# Patient Record
Sex: Female | Born: 1990 | Race: White | Hispanic: No | Marital: Married | Smoking: Never smoker
Health system: Southern US, Community
[De-identification: ages and names within clinical notes are randomized; demographics above are authoritative.]

## PROBLEM LIST (undated history)

## (undated) ENCOUNTER — Inpatient Hospital Stay (HOSPITAL_COMMUNITY): Payer: Medicaid Other

## (undated) ENCOUNTER — Inpatient Hospital Stay (HOSPITAL_COMMUNITY): Payer: Self-pay

## (undated) DIAGNOSIS — Z789 Other specified health status: Secondary | ICD-10-CM

## (undated) HISTORY — PX: NO PAST SURGERIES: SHX2092

## (undated) HISTORY — PX: MYRINGOPLASTY: SUR873

---
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
## 2013-10-10 LAB — OB RESULTS CONSOLE GC/CHLAMYDIA
CHLAMYDIA, DNA PROBE: NEGATIVE
GC PROBE AMP, GENITAL: NEGATIVE

## 2013-11-06 NOTE — L&D Delivery Note (Signed)
Delivery Note  SVD viable female Apgars 8,9 over intact perineum.  Placenta delivered spontaneously intact with 3VC. good support and hemostasis noted.  PH art was sent.  [place] cord blood was not done.  Mother and baby were doing well.  EBL 400cc  Candice Campavid Lowe, MD

## 2013-11-10 LAB — OB RESULTS CONSOLE ANTIBODY SCREEN: ANTIBODY SCREEN: NEGATIVE

## 2013-11-10 LAB — OB RESULTS CONSOLE HIV ANTIBODY (ROUTINE TESTING): HIV: NONREACTIVE

## 2013-11-10 LAB — OB RESULTS CONSOLE RPR: RPR: NONREACTIVE

## 2013-11-10 LAB — OB RESULTS CONSOLE RUBELLA ANTIBODY, IGM: Rubella: IMMUNE

## 2013-11-10 LAB — OB RESULTS CONSOLE ABO/RH: RH TYPE: POSITIVE

## 2013-11-10 LAB — OB RESULTS CONSOLE HEPATITIS B SURFACE ANTIGEN: Hepatitis B Surface Ag: NEGATIVE

## 2013-11-11 ENCOUNTER — Inpatient Hospital Stay (HOSPITAL_COMMUNITY)
Admission: AD | Admit: 2013-11-11 | Discharge: 2013-11-11 | Disposition: A | Discharge status: Home / Self Care | Payer: Medicaid Other | Source: Ambulatory Visit | Attending: Obstetrics and Gynecology | Admitting: Obstetrics and Gynecology

## 2013-11-11 ENCOUNTER — Encounter (HOSPITAL_COMMUNITY): Payer: Self-pay

## 2013-11-11 DIAGNOSIS — N898 Other specified noninflammatory disorders of vagina: Secondary | ICD-10-CM | POA: Insufficient documentation

## 2013-11-11 DIAGNOSIS — O26899 Other specified pregnancy related conditions, unspecified trimester: Secondary | ICD-10-CM

## 2013-11-11 DIAGNOSIS — R109 Unspecified abdominal pain: Secondary | ICD-10-CM | POA: Insufficient documentation

## 2013-11-11 DIAGNOSIS — O99891 Other specified diseases and conditions complicating pregnancy: Secondary | ICD-10-CM | POA: Insufficient documentation

## 2013-11-11 DIAGNOSIS — O26859 Spotting complicating pregnancy, unspecified trimester: Secondary | ICD-10-CM | POA: Insufficient documentation

## 2013-11-11 DIAGNOSIS — O9989 Other specified diseases and conditions complicating pregnancy, childbirth and the puerperium: Principal | ICD-10-CM

## 2013-11-11 HISTORY — DX: Other specified health status: Z78.9

## 2013-11-11 LAB — URINALYSIS, ROUTINE W REFLEX MICROSCOPIC
BILIRUBIN URINE: NEGATIVE
Glucose, UA: NEGATIVE mg/dL
KETONES UR: NEGATIVE mg/dL
Leukocytes, UA: NEGATIVE
NITRITE: NEGATIVE
PH: 7 (ref 5.0–8.0)
Protein, ur: NEGATIVE mg/dL
Specific Gravity, Urine: 1.02 (ref 1.005–1.030)
Urobilinogen, UA: 0.2 mg/dL (ref 0.0–1.0)

## 2013-11-11 LAB — URINE MICROSCOPIC-ADD ON

## 2013-11-11 LAB — WET PREP, GENITAL
Clue Cells Wet Prep HPF POC: NONE SEEN
Trich, Wet Prep: NONE SEEN
Yeast Wet Prep HPF POC: NONE SEEN

## 2013-11-11 NOTE — MAU Note (Signed)
Patient states she had brown spotting x 2 this am, no active bleeding. States she is having a little cramping.

## 2013-11-11 NOTE — MAU Provider Note (Signed)
History     CSN: [id number]  Arrival date and time: 11/11/13 1011   First Provider Initiated Contact with Patient 11/11/13 1116      Chief Complaint  Patient presents with  . Vaginal Bleeding  . Abdominal Cramping   HPI  Pt is a 23 yo G1P0 at 5020w6d wks IUP here with brown spotting x 2 this morning, no active bleeding.  + intermittent cramping, none at this time.  No problems in pregnancy to date.     Past Medical History  Diagnosis Date  . Medical history non-contributory     Past Surgical History  Procedure Laterality Date  . Myringoplasty      History reviewed. No pertinent family history.  History  Substance Use Topics  . Smoking status: Never Smoker   . Smokeless tobacco: Not on file  . Alcohol Use: Yes    Allergies: No Known Allergies  Prescriptions prior to admission  Medication Sig Dispense Refill  . Prenatal Vit-Fe Fumarate-FA (PRENATAL MULTIVITAMIN) TABS tablet Take 1 tablet by mouth daily at 12 noon.        Review of Systems  Gastrointestinal: Positive for abdominal pain (cramping).  Genitourinary: Negative for dysuria, urgency and frequency.       Brown spotting  All other systems reviewed and are negative.   Physical Exam   Blood pressure 119/72, pulse 85, temperature 98 F (36.7 C), temperature source Oral, resp. rate 20, height 5\' 4"  (1.626 m), weight 60.782 kg (134 lb).  Physical Exam  Constitutional: She is oriented to person, place, and time. She appears well-developed and well-nourished.  HENT:  Head: Normocephalic.  Neck: Normal range of motion. Neck supple.  Cardiovascular: Normal rate, regular rhythm and normal heart sounds.   Respiratory: Effort normal and breath sounds normal.  GI: Soft. There is no tenderness.  Genitourinary: No bleeding around the vagina. Vaginal discharge (mucusy, dark brown discharge) found.  Cervix closed  Neurological: She is alert and oriented to person, place, and time.  Skin: Skin is warm and dry.    FHR 140  MAU Course  Procedures 1126 Dr. Marcelle OverlieHolland called goes directly to voicemail > left message 1200 Dr. Marcelle OverlieHolland called goes directly to voicemail > called office > reviewed HPI/exam/ob history > provide reassurance and have patient follow-up in office.    Results for orders placed during the hospital encounter of 11/11/13 (from the past 24 hour(s))  URINALYSIS, ROUTINE W REFLEX MICROSCOPIC     Status: Abnormal   Collection Time    11/11/13 10:15 AM      Result Value Range   Color, Urine YELLOW  YELLOW   APPearance CLEAR  CLEAR   Specific Gravity, Urine 1.020  1.005 - 1.030   pH 7.0  5.0 - 8.0   Glucose, UA NEGATIVE  NEGATIVE mg/dL   Hgb urine dipstick MODERATE (*) NEGATIVE   Bilirubin Urine NEGATIVE  NEGATIVE   Ketones, ur NEGATIVE  NEGATIVE mg/dL   Protein, ur NEGATIVE  NEGATIVE mg/dL   Urobilinogen, UA 0.2  0.0 - 1.0 mg/dL   Nitrite NEGATIVE  NEGATIVE   Leukocytes, UA NEGATIVE  NEGATIVE  URINE MICROSCOPIC-ADD ON     Status: Abnormal   Collection Time    11/11/13 10:15 AM      Result Value Range   Squamous Epithelial / LPF FEW (*) RARE   RBC / HPF 0-2  <3 RBC/hpf  WET PREP, GENITAL     Status: Abnormal   Collection Time    11/11/13 11:33  AM      Result Value Range   Yeast Wet Prep HPF POC NONE SEEN  NONE SEEN   Trich, Wet Prep NONE SEEN  NONE SEEN   Clue Cells Wet Prep HPF POC NONE SEEN  NONE SEEN   WBC, Wet Prep HPF POC FEW (*) NONE SEEN     Assessment and Plan  23 yo G1P0 at [redacted]w[redacted]d wks IUP Vaginal Discharge  Plan: Discharge to home Follow up in office Bleeding precautions given.    [facility] 11/11/2013, 11:17 AM

## 2013-11-11 NOTE — Progress Notes (Signed)
Written and verbal d/c instructions given and understanding voiced. 

## 2013-11-13 ENCOUNTER — Inpatient Hospital Stay (HOSPITAL_COMMUNITY): Payer: Medicaid Other

## 2013-11-13 ENCOUNTER — Inpatient Hospital Stay (HOSPITAL_COMMUNITY)
Admission: AD | Admit: 2013-11-13 | Discharge: 2013-11-13 | Disposition: A | Discharge status: Home / Self Care | Payer: Medicaid Other | Source: Ambulatory Visit | Attending: Obstetrics and Gynecology | Admitting: Obstetrics and Gynecology

## 2013-11-13 ENCOUNTER — Encounter (HOSPITAL_COMMUNITY): Payer: Self-pay | Admitting: *Deleted

## 2013-11-13 DIAGNOSIS — O4690 Antepartum hemorrhage, unspecified, unspecified trimester: Secondary | ICD-10-CM

## 2013-11-13 DIAGNOSIS — R109 Unspecified abdominal pain: Secondary | ICD-10-CM | POA: Insufficient documentation

## 2013-11-13 DIAGNOSIS — O209 Hemorrhage in early pregnancy, unspecified: Secondary | ICD-10-CM | POA: Insufficient documentation

## 2013-11-13 DIAGNOSIS — O4692 Antepartum hemorrhage, unspecified, second trimester: Secondary | ICD-10-CM

## 2013-11-13 NOTE — MAU Provider Note (Signed)
  History     CSN: [id number]  Arrival date and time: 11/13/13 [id number]   None     Chief Complaint  Patient presents with  . Vaginal Bleeding   HPI  Pt is G1P0 at [redacted] weeks pregnant who presents for evaluation of vaginal bleeding in pregnancy. Pt was seen 2 days ago with dark brown spotting, which has continued since then with a small clot This morning.  Pt denies UTI symptoms, constipation,diarrhea.  Pt denies nausea or vomiting. Pt was to f/u with scheduled appointment on 1/8 (tomorrow).  Pt called the office this morning and they told her  To come to MAU.  Past Medical History  Diagnosis Date  . Medical history non-contributory     Past Surgical History  Procedure Laterality Date  . Myringoplasty      Family History  Problem Relation Age of Onset  . Miscarriages / Stillbirths Maternal Grandmother   . Hypertension Maternal Grandmother     History  Substance Use Topics  . Smoking status: Never Smoker   . Smokeless tobacco: Not on file  . Alcohol Use: Yes    Allergies: No Known Allergies  Prescriptions prior to admission  Medication Sig Dispense Refill  . Prenatal Vit-Fe Fumarate-FA (PRENATAL MULTIVITAMIN) TABS tablet Take 1 tablet by mouth daily at 12 noon.        Review of Systems  Constitutional: Negative for fever and chills.  Gastrointestinal: Negative for nausea, abdominal pain and diarrhea.  Genitourinary: Negative for dysuria.   Physical Exam   Blood pressure 122/73, pulse 84, temperature 97.8 F (36.6 C), height 5\' 4"  (1.626 m), weight 63.05 kg (139 lb).  Physical Exam  Nursing note and vitals reviewed. Constitutional: She is oriented to person, place, and time. She appears well-developed and well-nourished. No distress.  HENT:  Head: Normocephalic.  Eyes: Pupils are equal, round, and reactive to light.  Neck: Normal range of motion.  Cardiovascular: Normal rate.   Respiratory: Effort normal.  GI: Soft. She exhibits no distension. There is no  tenderness. There is no rebound and no guarding.  Genitourinary:  Small amount of bright red blood in vault; cervix closed, NT  Musculoskeletal: Normal range of motion.  Neurological: She is alert and oriented to person, place, and time.  Skin: Skin is warm and dry.  Psychiatric: She has a normal mood and affect.    MAU Course  Procedures Discussed with Dr. Rana SnareLowe- will go ahead and do ultrasound today and have pt follow up tomorrow No results found. [facility]  11/13/2013   OBSTETRICAL ULTRASOUND: This exam was performed within a [place] [facility] Department. The OB US report was generated in the [facility] system, and faxed to the ordering physician.   This report is also available in [facility] [employer] and in the [employer] PACS. See [facility] Obstetric US report.  Assessment and Plan  Vaginal bleeding in 2nd trimester pregnancy- no known cause F/u at Dr. Vance GatherLowe's office tomorrow   Pamelia HoitLINEBERRY,SUSAN 11/13/2013, 10:47 AM

## 2013-11-13 NOTE — MAU Note (Signed)
Pt stated she went to the BR this morning a a big clot came out . C/O mild cramping.

## 2013-11-13 NOTE — Discharge Instructions (Signed)
Pelvic Rest Pelvic rest is sometimes recommended for women when:   The placenta is partially or completely covering the opening of the cervix (placenta previa).  There is bleeding between the uterine wall and the amniotic sac in the first trimester (subchorionic hemorrhage).  The cervix begins to open without labor starting (incompetent cervix, cervical insufficiency).  The labor is too early (preterm labor). HOME CARE INSTRUCTIONS  Do not have sexual intercourse, stimulation, or an orgasm.  Do not use tampons, douche, or put anything in the vagina.  Do not lift anything over 10 pounds (4.5 kg).  Avoid strenuous activity or straining your pelvic muscles. SEEK MEDICAL CARE IF:  You have any vaginal bleeding during pregnancy. Treat this as a potential emergency.  You have cramping pain felt low in the stomach (stronger than menstrual cramps).  You notice vaginal discharge (watery, mucus, or bloody).  You have a low, dull backache.  There are regular contractions or uterine tightening. SEEK IMMEDIATE MEDICAL CARE IF: You have vaginal bleeding and have placenta previa.  Document Released: 02/17/2011 Document Revised: 01/15/2012 Document Reviewed: 02/17/2011 [facility] Patient Information 2014 [place], [place].  Vaginal Bleeding During Pregnancy, First Trimester A small amount of bleeding (spotting) is relatively common in early pregnancy. It usually stops on its own. There are many causes for bleeding or spotting in early pregnancy. Some bleeding may be related to the pregnancy and some may not. Cramping with the bleeding is more serious and concerning. Tell your caregiver if you have any vaginal bleeding.  CAUSES   It is normal in most cases.  The pregnancy ends (miscarriage).  The pregnancy may end (threatened miscarriage).  Infection or inflammation of the cervix.  Growths (polyps) on the cervix.  Pregnancy happens outside of the uterus and in a fallopian tube (tubal  pregnancy).  Many tiny cysts in the uterus instead of pregnancy tissue (molar pregnancy). SYMPTOMS  Vaginal bleeding or spotting with or without cramps. DIAGNOSIS  To evaluate the pregnancy, your caregiver may:  Do a pelvic exam.  Take blood tests.  Do an ultrasound. It is very important to follow your caregiver's instructions.  TREATMENT   Evaluation of the pregnancy with blood tests and ultrasound.  Bed rest (getting up to use the bathroom only).  Rho-gam immunization if the mother is Rh negative and the father is Rh positive. HOME CARE INSTRUCTIONS   If your caregiver orders bed rest, you may need to make arrangements for the care of other children and for other responsibilities. However, your caregiver may allow you to continue light activity.  Keep track of the number of pads you use each day, how often you change pads and how soaked (saturated) they are. Write this down.  Do not use tampons. Do not douche.  Do not have sexual intercourse or orgasms until approved by your physician.  Save any tissue that you pass for your caregiver to see.  Take medicine for cramps only with your caregiver's permission.  Do not take aspirin because it can make you bleed. SEEK IMMEDIATE MEDICAL CARE IF:   You experience severe cramps in your stomach, back or belly (abdomen).  You have an oral temperature above 102 F (38.9 C), not controlled by medicine.  You pass large clots or tissue.  Your bleeding increases or you become light-headed, weak or have fainting episodes.  You develop chills.  You are leaking or have a gush of fluid from your vagina.  You pass out while having a bowel movement. That may  mean you have a ruptured tubal pregnancy. Document Released: 08/02/2005 Document Revised: 01/15/2012 Document Reviewed: 02/11/2009 [facility] Patient Information 2014 [place], [place].

## 2014-01-31 ENCOUNTER — Inpatient Hospital Stay (HOSPITAL_COMMUNITY)
Admission: AD | Admit: 2014-01-31 | Discharge: 2014-01-31 | Disposition: A | Discharge status: Home / Self Care | Payer: Medicaid Other | Source: Ambulatory Visit | Attending: Obstetrics and Gynecology | Admitting: Obstetrics and Gynecology

## 2014-01-31 ENCOUNTER — Encounter (HOSPITAL_COMMUNITY): Payer: Self-pay

## 2014-01-31 DIAGNOSIS — N858 Other specified noninflammatory disorders of uterus: Secondary | ICD-10-CM

## 2014-01-31 DIAGNOSIS — O99891 Other specified diseases and conditions complicating pregnancy: Secondary | ICD-10-CM | POA: Insufficient documentation

## 2014-01-31 DIAGNOSIS — M940 Chondrocostal junction syndrome [Tietze]: Secondary | ICD-10-CM | POA: Insufficient documentation

## 2014-01-31 DIAGNOSIS — O9989 Other specified diseases and conditions complicating pregnancy, childbirth and the puerperium: Principal | ICD-10-CM

## 2014-01-31 DIAGNOSIS — O4700 False labor before 37 completed weeks of gestation, unspecified trimester: Secondary | ICD-10-CM | POA: Insufficient documentation

## 2014-01-31 DIAGNOSIS — R079 Chest pain, unspecified: Secondary | ICD-10-CM | POA: Insufficient documentation

## 2014-01-31 DIAGNOSIS — N859 Noninflammatory disorder of uterus, unspecified: Secondary | ICD-10-CM

## 2014-01-31 LAB — FETAL FIBRONECTIN: Fetal Fibronectin: NEGATIVE

## 2014-01-31 LAB — URINALYSIS, ROUTINE W REFLEX MICROSCOPIC
Bilirubin Urine: NEGATIVE
Glucose, UA: NEGATIVE mg/dL
Hgb urine dipstick: NEGATIVE
Ketones, ur: NEGATIVE mg/dL
Nitrite: NEGATIVE
PH: 7 (ref 5.0–8.0)
PROTEIN: NEGATIVE mg/dL
Specific Gravity, Urine: 1.015 (ref 1.005–1.030)
UROBILINOGEN UA: 0.2 mg/dL (ref 0.0–1.0)

## 2014-01-31 LAB — URINE MICROSCOPIC-ADD ON

## 2014-01-31 MED ORDER — ACETAMINOPHEN 325 MG PO TABS
650.0000 mg | ORAL_TABLET | Freq: Once | ORAL | Status: AC
  Administered 2014-01-31: 650 mg via ORAL
  Filled 2014-01-31: qty 2

## 2014-01-31 NOTE — Discharge Instructions (Signed)

## 2014-01-31 NOTE — Progress Notes (Signed)
Rib pain changes with position

## 2014-01-31 NOTE — MAU Note (Signed)
Had pains in my lower ribs earlier this am and was able to go back to sleep. Awoke again about 0500 and had more rib pain. Hurts more when i take a deep breath or get in uncomfortable position.

## 2014-01-31 NOTE — MAU Provider Note (Signed)
History     CSN: [id number]  Arrival date and time: 01/31/14 0711   First Provider Initiated Contact with Patient 01/31/14 [phone]      Chief Complaint  Patient presents with  . Chest Pain   HPI This is a 23 y.o. female at [redacted]w[redacted]d who presents with c/o sharp pain in between ribs on both sides of anterior lower ribs.  Started this morning, and hurts worse with movement or deep breathing. Denies recent URI or fever. Denies palpitations. Denies OB complaints, does not feel any cramping.  RN Note: Had pains in my lower ribs earlier this am and was able to go back to sleep. Awoke again about 0500 and had more rib pain. Hurts more when i take a deep breath or get in uncomfortable position.       OB History   Grav Para Term Preterm Abortions TAB SAB Ect Mult Living   1               Past Medical History  Diagnosis Date  . Medical history non-contributory     Past Surgical History  Procedure Laterality Date  . Myringoplasty      Family History  Problem Relation Age of Onset  . Miscarriages / Stillbirths Maternal Grandmother   . Hypertension Maternal Grandmother     History  Substance Use Topics  . Smoking status: Never Smoker   . Smokeless tobacco: Not on file  . Alcohol Use: Yes    Allergies: No Known Allergies  Prescriptions prior to admission  Medication Sig Dispense Refill  . Prenatal Vit-Fe Fumarate-FA (PRENATAL MULTIVITAMIN) TABS tablet Take 1 tablet by mouth daily at 12 noon.        Review of Systems  Constitutional: Negative for fever, chills and malaise/fatigue.  Respiratory: Negative for cough, sputum production, shortness of breath and wheezing.   Cardiovascular: Positive for chest pain. Negative for palpitations, orthopnea and leg swelling.  Gastrointestinal: Negative for nausea, vomiting and abdominal pain.  Musculoskeletal: Negative for myalgias.  Neurological: Negative for headaches.   Physical Exam   Blood pressure 129/71, pulse 86, temperature  97.8 F (36.6 C), resp. rate 20, height 5\' 4"  (1.626 m), weight 71.305 kg (157 lb 3.2 oz), SpO2 100.00%.  Physical Exam  Constitutional: She is oriented to person, place, and time. She appears well-developed and well-nourished. No distress.  HENT:  Head: Normocephalic.  Cardiovascular: Normal rate, regular rhythm and normal heart sounds.  Exam reveals no gallop and no friction rub.   No murmur heard. Respiratory: Effort normal. No respiratory distress. She has no wheezes. She has no rales.  GI: Soft. There is no tenderness. There is no rebound and no guarding.  Fetal heart rate reactive Uterine irritability noted, patient does not feel them  Genitourinary: Vagina normal and uterus normal. No vaginal discharge found.  Cervix closed/long/high Fetal fibronectin collected  Musculoskeletal: Normal range of motion.  Neurological: She is alert and oriented to person, place, and time.  Skin: Skin is warm and dry.  Psychiatric: She has a normal mood and affect.    MAU Course  Procedures  MDM Discussed with Dr Marcelle Overlie.  Will send FFn and wait for result. Would not tocolyze if negative. PO hydration. Tylenol for rib pain Results for orders placed during the hospital encounter of 01/31/14 (from the past 24 hour(s))  URINALYSIS, ROUTINE W REFLEX MICROSCOPIC     Status: Abnormal   Collection Time    01/31/14  7:20 AM      Result  Value Ref Range   Color, Urine YELLOW  YELLOW   APPearance CLEAR  CLEAR   Specific Gravity, Urine 1.015  1.005 - 1.030   pH 7.0  5.0 - 8.0   Glucose, UA NEGATIVE  NEGATIVE mg/dL   Hgb urine dipstick NEGATIVE  NEGATIVE   Bilirubin Urine NEGATIVE  NEGATIVE   Ketones, ur NEGATIVE  NEGATIVE mg/dL   Protein, ur NEGATIVE  NEGATIVE mg/dL   Urobilinogen, UA 0.2  0.0 - 1.0 mg/dL   Nitrite NEGATIVE  NEGATIVE   Leukocytes, UA SMALL (*) NEGATIVE  URINE MICROSCOPIC-ADD ON     Status: Abnormal   Collection Time    01/31/14  7:20 AM      Result Value Ref Range   Squamous  Epithelial / LPF FEW (*) RARE   WBC, UA 3-6  <3 WBC/hpf   Bacteria, UA FEW (*) RARE   Urine-Other MUCOUS PRESENT    FETAL FIBRONECTIN     Status: None   Collection Time    01/31/14  8:42 AM      Result Value Ref Range   Fetal Fibronectin NEGATIVE  NEGATIVE    Assessment and Plan  A:  SIUP at 6641w3d        Costochondritis       Uterine irritability with negative fetal fibronectin  P:  Discharge home       Tylenol for pain, ice to area        PTL precautions        Followup in office          [facility],Lazaro Isenhower 01/31/2014, 8:43 AM

## 2014-03-27 LAB — OB RESULTS CONSOLE GBS: GBS: NEGATIVE

## 2014-04-19 ENCOUNTER — Inpatient Hospital Stay (HOSPITAL_COMMUNITY)
Admission: AD | Admit: 2014-04-19 | Discharge: 2014-04-19 | Disposition: A | Discharge status: Home / Self Care | Payer: 59 | Source: Ambulatory Visit | Attending: Obstetrics and Gynecology | Admitting: Obstetrics and Gynecology

## 2014-04-19 ENCOUNTER — Encounter (HOSPITAL_COMMUNITY): Payer: Self-pay | Admitting: *Deleted

## 2014-04-19 DIAGNOSIS — O479 False labor, unspecified: Secondary | ICD-10-CM | POA: Diagnosis not present

## 2014-04-19 NOTE — MAU Note (Signed)
Pt. Started to have contractions every 2 mins around 0450 this am. Feels that contractions are stronger when walking. Also, feeling pressure. Denies any leakage of fluid. Denies bleeding. Baby has been moving well.

## 2014-04-19 NOTE — Progress Notes (Signed)
Notified pt labor eval, cervix 1.5/50/-2, efm tracing reactive, will have pt walk x1 hr then recheck.

## 2014-04-19 NOTE — Discharge Instructions (Signed)
Third Trimester of Pregnancy  The third trimester is from week 29 through week 42, months 7 through 9. The third trimester is a time when the fetus is growing rapidly. At the end of the ninth month, the fetus is about 20 inches in length and weighs 6 10 pounds.   BODY CHANGES  Your body goes through many changes during pregnancy. The changes vary from woman to woman.    Your weight will continue to increase. You can expect to gain 25 35 pounds (11 16 kg) by the end of the pregnancy.   You may begin to get stretch marks on your hips, abdomen, and breasts.   You may urinate more often because the fetus is moving lower into your pelvis and pressing on your bladder.   You may develop or continue to have heartburn as a result of your pregnancy.   You may develop constipation because certain hormones are causing the muscles that push waste through your intestines to slow down.   You may develop hemorrhoids or swollen, bulging veins (varicose veins).   You may have pelvic pain because of the weight gain and pregnancy hormones relaxing your joints between the bones in your pelvis. Back aches may result from over exertion of the muscles supporting your posture.   Your breasts will continue to grow and be tender. A yellow discharge may leak from your breasts called colostrum.   Your belly button may stick out.   You may feel short of breath because of your expanding uterus.   You may notice the fetus "dropping," or moving lower in your abdomen.   You may have a bloody mucus discharge. This usually occurs a few days to a week before labor begins.   Your cervix becomes thin and soft (effaced) near your due date.  WHAT TO EXPECT AT YOUR PRENATAL EXAMS   You will have prenatal exams every 2 weeks until week 36. Then, you will have weekly prenatal exams. During a routine prenatal visit:   You will be weighed to make sure you and the fetus are growing normally.   Your blood pressure is taken.   Your abdomen will be  measured to track your baby's growth.   The fetal heartbeat will be listened to.   Any test results from the previous visit will be discussed.   You may have a cervical check near your due date to see if you have effaced.  At around 36 weeks, your caregiver will check your cervix. At the same time, your caregiver will also perform a test on the secretions of the vaginal tissue. This test is to determine if a type of bacteria, Group B streptococcus, is present. Your caregiver will explain this further.  Your caregiver may ask you:   What your birth plan is.   How you are feeling.   If you are feeling the baby move.   If you have had any abnormal symptoms, such as leaking fluid, bleeding, severe headaches, or abdominal cramping.   If you have any questions.  Other tests or screenings that may be performed during your third trimester include:   Blood tests that check for low iron levels (anemia).   Fetal testing to check the health, activity level, and growth of the fetus. Testing is done if you have certain medical conditions or if there are problems during the pregnancy.  FALSE LABOR  You may feel small, irregular contractions that eventually go away. These are called Braxton Hicks contractions, or   false labor. Contractions may last for hours, days, or even weeks before true labor sets in. If contractions come at regular intervals, intensify, or become painful, it is best to be seen by your caregiver.   SIGNS OF LABOR    Menstrual-like cramps.   Contractions that are 5 minutes apart or less.   Contractions that start on the top of the uterus and spread down to the lower abdomen and back.   A sense of increased pelvic pressure or back pain.   A watery or bloody mucus discharge that comes from the vagina.  If you have any of these signs before the 37th week of pregnancy, call your caregiver right away. You need to go to the hospital to get checked immediately.  HOME CARE INSTRUCTIONS    Avoid all  smoking, herbs, alcohol, and unprescribed drugs. These chemicals affect the formation and growth of the baby.   Follow your caregiver's instructions regarding medicine use. There are medicines that are either safe or unsafe to take during pregnancy.   Exercise only as directed by your caregiver. Experiencing uterine cramps is a good sign to stop exercising.   Continue to eat regular, healthy meals.   Wear a good support bra for breast tenderness.   Do not use hot tubs, steam rooms, or saunas.   Wear your seat belt at all times when driving.   Avoid raw meat, uncooked cheese, cat litter boxes, and soil used by cats. These carry germs that can cause birth defects in the baby.   Take your prenatal vitamins.   Try taking a stool softener (if your caregiver approves) if you develop constipation. Eat more high-fiber foods, such as fresh vegetables or fruit and whole grains. Drink plenty of fluids to keep your urine clear or pale yellow.   Take warm sitz baths to soothe any pain or discomfort caused by hemorrhoids. Use hemorrhoid cream if your caregiver approves.   If you develop varicose veins, wear support hose. Elevate your feet for 15 minutes, 3 4 times a day. Limit salt in your diet.   Avoid heavy lifting, wear low heal shoes, and practice good posture.   Rest a lot with your legs elevated if you have leg cramps or low back pain.   Visit your dentist if you have not gone during your pregnancy. Use a soft toothbrush to brush your teeth and be gentle when you floss.   A sexual relationship may be continued unless your caregiver directs you otherwise.   Do not travel far distances unless it is absolutely necessary and only with the approval of your caregiver.   Take prenatal classes to understand, practice, and ask questions about the labor and delivery.   Make a trial run to the hospital.   Pack your hospital bag.   Prepare the baby's nursery.   Continue to go to all your prenatal visits as directed  by your caregiver.  SEEK MEDICAL CARE IF:   You are unsure if you are in labor or if your water has broken.   You have dizziness.   You have mild pelvic cramps, pelvic pressure, or nagging pain in your abdominal area.   You have persistent nausea, vomiting, or diarrhea.   You have a bad smelling vaginal discharge.   You have pain with urination.  SEEK IMMEDIATE MEDICAL CARE IF:    You have a fever.   You are leaking fluid from your vagina.   You have spotting or bleeding from your vagina.     You have severe abdominal cramping or pain.   You have rapid weight loss or gain.   You have shortness of breath with chest pain.   You notice sudden or extreme swelling of your face, hands, ankles, feet, or legs.   You have not felt your baby move in over an hour.   You have severe headaches that do not go away with medicine.   You have vision changes.  Document Released: 10/17/2001 Document Revised: 06/25/2013 Document Reviewed: 12/24/2012  [employer] Patient Information 2014 [employer].

## 2014-04-28 ENCOUNTER — Encounter (HOSPITAL_COMMUNITY): Payer: Self-pay

## 2014-04-28 ENCOUNTER — Inpatient Hospital Stay (HOSPITAL_COMMUNITY)
Admission: AD | Admit: 2014-04-28 | Discharge: 2014-04-30 | DRG: 775 | Disposition: A | Discharge status: Home / Self Care | Payer: 59 | Source: Ambulatory Visit | Attending: Obstetrics and Gynecology | Admitting: Obstetrics and Gynecology

## 2014-04-28 DIAGNOSIS — K649 Unspecified hemorrhoids: Secondary | ICD-10-CM | POA: Diagnosis present

## 2014-04-28 DIAGNOSIS — O878 Other venous complications in the puerperium: Principal | ICD-10-CM | POA: Diagnosis present

## 2014-04-28 DIAGNOSIS — IMO0001 Reserved for inherently not codable concepts without codable children: Secondary | ICD-10-CM

## 2014-04-28 DIAGNOSIS — O479 False labor, unspecified: Secondary | ICD-10-CM | POA: Diagnosis present

## 2014-04-28 LAB — TYPE AND SCREEN
ABO/RH(D): AB POS
Antibody Screen: NEGATIVE

## 2014-04-28 LAB — CBC
HEMATOCRIT: 36.4 % (ref 36.0–46.0)
HEMOGLOBIN: 12.5 g/dL (ref 12.0–15.0)
MCH: 31.3 pg (ref 26.0–34.0)
MCHC: 34.3 g/dL (ref 30.0–36.0)
MCV: 91 fL (ref 78.0–100.0)
Platelets: 246 10*3/uL (ref 150–400)
RBC: 4 MIL/uL (ref 3.87–5.11)
RDW: 13.6 % (ref 11.5–15.5)
WBC: 16 10*3/uL — AB (ref 4.0–10.5)

## 2014-04-28 LAB — RPR

## 2014-04-28 LAB — ABO/RH: ABO/RH(D): AB POS

## 2014-04-28 MED ORDER — IBUPROFEN 600 MG PO TABS
600.0000 mg | ORAL_TABLET | Freq: Four times a day (QID) | ORAL | Status: DC
  Administered 2014-04-29 – 2014-04-30 (×7): 600 mg via ORAL
  Filled 2014-04-28 (×7): qty 1

## 2014-04-28 MED ORDER — SIMETHICONE 80 MG PO CHEW: 80.0000 mg | CHEWABLE_TABLET | ORAL | Status: DC | PRN

## 2014-04-28 MED ORDER — BUTORPHANOL TARTRATE 1 MG/ML IJ SOLN: 1.0000 mg | INTRAMUSCULAR | Status: DC | PRN

## 2014-04-28 MED ORDER — MEDROXYPROGESTERONE ACETATE 150 MG/ML IM SUSP: 150.0000 mg | INTRAMUSCULAR | Status: DC | PRN

## 2014-04-28 MED ORDER — ZOLPIDEM TARTRATE 5 MG PO TABS: 5.0000 mg | ORAL_TABLET | Freq: Every evening | ORAL | Status: DC | PRN

## 2014-04-28 MED ORDER — LACTATED RINGERS IV SOLN
INTRAVENOUS | Status: DC
Start: 2014-04-28 — End: 2014-04-28

## 2014-04-28 MED ORDER — BENZOCAINE-MENTHOL 20-0.5 % EX AERO
1.0000 "application " | INHALATION_SPRAY | CUTANEOUS | Status: DC | PRN
  Administered 2014-04-29: 1 via TOPICAL
  Filled 2014-04-28: qty 56

## 2014-04-28 MED ORDER — ONDANSETRON HCL 4 MG PO TABS: 4.0000 mg | ORAL_TABLET | ORAL | Status: DC | PRN

## 2014-04-28 MED ORDER — ERYTHROMYCIN 5 MG/GM OP OINT
TOPICAL_OINTMENT | OPHTHALMIC | Status: AC
  Filled 2014-04-28: qty 1

## 2014-04-28 MED ORDER — DIBUCAINE 1 % RE OINT
1.0000 "application " | TOPICAL_OINTMENT | RECTAL | Status: DC | PRN
  Administered 2014-04-30: 1 via RECTAL
  Filled 2014-04-28: qty 28

## 2014-04-28 MED ORDER — ONDANSETRON HCL 4 MG/2ML IJ SOLN: 4.0000 mg | Freq: Four times a day (QID) | INTRAMUSCULAR | Status: DC | PRN

## 2014-04-28 MED ORDER — FLEET ENEMA 7-19 GM/118ML RE ENEM: 1.0000 | ENEMA | RECTAL | Status: DC | PRN

## 2014-04-28 MED ORDER — OXYCODONE-ACETAMINOPHEN 5-325 MG PO TABS: 1.0000 | ORAL_TABLET | ORAL | Status: DC | PRN

## 2014-04-28 MED ORDER — WITCH HAZEL-GLYCERIN EX PADS
1.0000 "application " | MEDICATED_PAD | CUTANEOUS | Status: DC | PRN
  Administered 2014-04-30: 1 via TOPICAL

## 2014-04-28 MED ORDER — EPHEDRINE 5 MG/ML INJ
10.0000 mg | INTRAVENOUS | Status: DC | PRN
  Filled 2014-04-28: qty 2

## 2014-04-28 MED ORDER — PROMETHAZINE HCL 25 MG/ML IJ SOLN: 12.5000 mg | Freq: Four times a day (QID) | INTRAMUSCULAR | Status: DC | PRN

## 2014-04-28 MED ORDER — TETANUS-DIPHTH-ACELL PERTUSSIS 5-2.5-18.5 LF-MCG/0.5 IM SUSP: 0.5000 mL | Freq: Once | INTRAMUSCULAR | Status: DC

## 2014-04-28 MED ORDER — LACTATED RINGERS IV SOLN: 500.0000 mL | INTRAVENOUS | Status: DC | PRN

## 2014-04-28 MED ORDER — ONDANSETRON HCL 4 MG/2ML IJ SOLN: 4.0000 mg | INTRAMUSCULAR | Status: DC | PRN

## 2014-04-28 MED ORDER — PHENYLEPHRINE 40 MCG/ML (10ML) SYRINGE FOR IV PUSH (FOR BLOOD PRESSURE SUPPORT)
80.0000 ug | PREFILLED_SYRINGE | INTRAVENOUS | Status: DC | PRN
  Filled 2014-04-28: qty 2

## 2014-04-28 MED ORDER — OXYTOCIN BOLUS FROM INFUSION: 500.0000 mL | INTRAVENOUS | Status: DC

## 2014-04-28 MED ORDER — ACETAMINOPHEN 325 MG PO TABS: 650.0000 mg | ORAL_TABLET | ORAL | Status: DC | PRN

## 2014-04-28 MED ORDER — OXYTOCIN 40 UNITS IN LACTATED RINGERS INFUSION - SIMPLE MED: 62.5000 mL/h | INTRAVENOUS | Status: DC

## 2014-04-28 MED ORDER — TERBUTALINE SULFATE 1 MG/ML IJ SOLN: 0.2500 mg | Freq: Once | INTRAMUSCULAR | Status: DC | PRN

## 2014-04-28 MED ORDER — OXYTOCIN 40 UNITS IN LACTATED RINGERS INFUSION - SIMPLE MED
1.0000 m[IU]/min | INTRAVENOUS | Status: DC
  Administered 2014-04-28: 2 m[IU]/min via INTRAVENOUS
  Filled 2014-04-28: qty 1000

## 2014-04-28 MED ORDER — LACTATED RINGERS IV SOLN: 500.0000 mL | Freq: Once | INTRAVENOUS | Status: DC

## 2014-04-28 MED ORDER — SENNOSIDES-DOCUSATE SODIUM 8.6-50 MG PO TABS
2.0000 | ORAL_TABLET | ORAL | Status: DC
  Administered 2014-04-29 (×2): 2 via ORAL
  Filled 2014-04-28 (×2): qty 2

## 2014-04-28 MED ORDER — LIDOCAINE HCL (PF) 1 % IJ SOLN
30.0000 mL | INTRAMUSCULAR | Status: DC | PRN
  Filled 2014-04-28: qty 30

## 2014-04-28 MED ORDER — LANOLIN HYDROUS EX OINT: TOPICAL_OINTMENT | CUTANEOUS | Status: DC | PRN

## 2014-04-28 MED ORDER — MEASLES, MUMPS & RUBELLA VAC ~~LOC~~ INJ
0.5000 mL | INJECTION | Freq: Once | SUBCUTANEOUS | Status: DC
  Filled 2014-04-28: qty 0.5

## 2014-04-28 MED ORDER — DIPHENHYDRAMINE HCL 50 MG/ML IJ SOLN: 12.5000 mg | INTRAMUSCULAR | Status: DC | PRN

## 2014-04-28 MED ORDER — FENTANYL 2.5 MCG/ML BUPIVACAINE 1/10 % EPIDURAL INFUSION (WH - ANES): 14.0000 mL/h | INTRAMUSCULAR | Status: DC | PRN

## 2014-04-28 MED ORDER — CITRIC ACID-SODIUM CITRATE 334-500 MG/5ML PO SOLN: 30.0000 mL | ORAL | Status: DC | PRN

## 2014-04-28 MED ORDER — PRENATAL MULTIVITAMIN CH
1.0000 | ORAL_TABLET | Freq: Every day | ORAL | Status: DC
  Administered 2014-04-29 – 2014-04-30 (×2): 1 via ORAL
  Filled 2014-04-28 (×2): qty 1

## 2014-04-28 MED ORDER — IBUPROFEN 600 MG PO TABS: 600.0000 mg | ORAL_TABLET | Freq: Four times a day (QID) | ORAL | Status: DC | PRN

## 2014-04-28 MED ORDER — DIPHENHYDRAMINE HCL 25 MG PO CAPS: 25.0000 mg | ORAL_CAPSULE | Freq: Four times a day (QID) | ORAL | Status: DC | PRN

## 2014-04-28 NOTE — H&P (Signed)
[name] is a 23 y.o. female presenting for labor sxs.  Admiited with ctxs every 3 minutes and cervical change to 4 cm.  Pregnancy uncomplicated.  GBS-. History OB History   Grav Para Term Preterm Abortions TAB SAB Ect Mult Living   1              Past Medical History  Diagnosis Date  . Medical history non-contributory    Past Surgical History  Procedure Laterality Date  . Myringoplasty     Family History: family history includes Hypertension in her maternal grandmother; Miscarriages / [place] in her maternal grandmother. Social History:  reports that she has never smoked. She has never used smokeless tobacco. She reports that she does not drink alcohol or use illicit drugs.   Prenatal Transfer Tool  Maternal Diabetes: No Genetic Screening: Normal Maternal Ultrasounds/Referrals: Normal Fetal Ultrasounds or other Referrals:  None Maternal Substance Abuse:  No Significant Maternal Medications:  None Significant Maternal Lab Results:  None Other Comments:  None  ROS  Dilation: 5 Effacement (%): 100 Station: -1 Exam by:: lee Blood pressure 130/74, pulse 77, temperature 98.5 F (36.9 C), temperature source Oral, resp. rate 18, height 5\' 4"  (1.626 m), weight 78.472 kg (173 lb). Exam Physical Exam  Prenatal labs: ABO, Rh: --/--/AB POS (06/23 1111) Antibody: NEG (06/23 1111) Rubella: Immune (01/05 0000) RPR: Nonreactive (01/05 0000)  HBsAg: Negative (01/05 0000)  HIV: Non-reactive (01/05 0000)  GBS: Negative (05/22 0000)   Assessment/Plan: IUP at term in active labor Anticipate SVD   LOWE,DAVID C 04/28/2014, 1:45 PM

## 2014-04-28 NOTE — MAU Note (Signed)
2 cm in office yesterday.  Contractions stronger at 5:30 a.  No leaking or bleeding.  Baby moving well per pt.

## 2014-04-29 ENCOUNTER — Encounter (HOSPITAL_COMMUNITY): Payer: Self-pay | Admitting: Student

## 2014-04-29 LAB — CBC
HCT: 27 % — ABNORMAL LOW (ref 36.0–46.0)
Hemoglobin: 9.2 g/dL — ABNORMAL LOW (ref 12.0–15.0)
MCH: 31 pg (ref 26.0–34.0)
MCHC: 34.1 g/dL (ref 30.0–36.0)
MCV: 90.9 fL (ref 78.0–100.0)
PLATELETS: 221 10*3/uL (ref 150–400)
RBC: 2.97 MIL/uL — AB (ref 3.87–5.11)
RDW: 13.8 % (ref 11.5–15.5)
WBC: 20.8 10*3/uL — AB (ref 4.0–10.5)

## 2014-04-29 MED ORDER — HYDROCORTISONE ACE-PRAMOXINE 1-1 % RE FOAM
1.0000 | Freq: Two times a day (BID) | RECTAL | Status: DC
  Administered 2014-04-29 – 2014-04-30 (×3): 1 via RECTAL
  Filled 2014-04-29 (×2): qty 10

## 2014-04-29 NOTE — Progress Notes (Signed)
Post Partum Day 1 Subjective: no complaints, up ad lib, voiding and tolerating PO  Objective: Blood pressure 127/74, pulse 110, temperature 98.1 F (36.7 C), temperature source Oral, resp. rate 18, height 5\' 4"  (1.626 m), weight 173 lb (78.472 kg), unknown if currently breastfeeding.  Physical Exam:  General: alert and cooperative Lochia: appropriate Uterine Fundus: firm Incision: perineum intact, small hemorrhoids DVT Evaluation: No evidence of DVT seen on physical exam. Negative Homan's sign. No cords or calf tenderness. No significant calf/ankle edema.   Recent Labs  04/28/14 1111 04/29/14 0635  HGB 12.5 9.2*  HCT 36.4 27.0*    Assessment/Plan: Plan for discharge tomorrow proctofoam   LOS: 1 day   CURTIS,CAROL G 04/29/2014, 8:34 AM

## 2014-04-29 NOTE — Progress Notes (Signed)
Ur chart review completed.  

## 2014-04-29 NOTE — Lactation Note (Signed)
This note was copied from the chart of [name]. Lactation Consultation Note  Patient Name: [name] [id number]'s Date: 04/29/2014 Reason for consult: Initial assessment Per mom baby has been to the breast several times. @consult  baby hungry - LC assisted - and instructed mom prior to latch , breast massage , hand express,  (steady flow of colostrum noted ), latch with breast compressions until swallows. Mom used the cross cradle  Positioning and LC assisted with depth. Multiply swallows noted , increased with breast compressions. Per mom comfortable. Baby fed 12 mins while LC present and still feeding with swallows. Mother informed of post-discharge support and given phone number to the lactation department, including services for phone call assistance; out-patient appointments; and breastfeeding support group. List of other breastfeeding resources in the community given in the handout. Encouraged mother to call for problems or concerns related to breastfeeding.Mother informed of post-discharge support and given phone number to the lactation department, including services for phone call assistance; out-patient appointments; and breastfeeding support group. List of other breastfeeding resources in the community given in the handout. Encouraged mother to call for problems or concerns related to breastfeeding.   Maternal Data Formula Feeding for Exclusion: No Infant to breast within first hour of birth: Yes (30 mins ) Has patient been taught Hand Expression?: Yes Does the patient have breastfeeding experience prior to this delivery?: No  Feeding Feeding Type: Breast Fed Length of feed:  (multiply swallows )  LATCH Score/Interventions Latch: Grasps breast easily, tongue down, lips flanged, rhythmical sucking. Intervention(s): Adjust position;Assist with latch;Breast massage;Breast compression  Audible Swallowing: Spontaneous and intermittent Intervention(s): Skin to skin;Hand  expression  Type of Nipple: Everted at rest and after stimulation  Comfort (Breast/Nipple): Soft / non-tender     Hold (Positioning): Assistance needed to correctly position infant at breast and maintain latch. (worked depth ) Intervention(s): Breastfeeding basics reviewed;Support Pillows;Position options;Skin to skin  LATCH Score: 9  Lactation Tools Discussed/Used WIC Program: Yes [facility]([place] per grandma)   Consult Status Consult Status: Follow-up Date: 04/30/14 Follow-up type: In-patient    Kathrin Greathouseorio, Margaret Ann 04/29/2014, 11:51 AM

## 2014-04-30 MED ORDER — IBUPROFEN 600 MG PO TABS: 600.0000 mg | ORAL_TABLET | Freq: Four times a day (QID) | ORAL | Status: AC

## 2014-04-30 NOTE — Discharge Summary (Signed)
Obstetric Discharge Summary Reason for Admission: onset of labor Prenatal Procedures: ultrasound Intrapartum Procedures: spontaneous vaginal delivery Postpartum Procedures: none Complications-Operative and Postpartum: none Hemoglobin  Date Value Ref Range Status  04/29/2014 9.2* 12.0 - 15.0 g/dL Final     DELTA CHECK NOTED     REPEATED TO VERIFY     HCT  Date Value Ref Range Status  04/29/2014 27.0* 36.0 - 46.0 % Final    Physical Exam:  General: alert and cooperative Lochia: appropriate Uterine Fundus: firm Incision: perineum intact DVT Evaluation: No evidence of DVT seen on physical exam. Negative Homan's sign. No cords or calf tenderness. No significant calf/ankle edema.  Discharge Diagnoses: Term Pregnancy-delivered  Discharge Information: Date: 04/30/2014 Activity: pelvic rest Diet: routine Medications: PNV and Ibuprofen Condition: stable Instructions: refer to practice specific booklet Discharge to: home   Newborn Data: Live born female  Birth Weight: 6 lb 11.6 oz (3050 g) APGAR: 8, 9  Home with mother.  CURTIS,CAROL G 04/30/2014, 8:42 AM

## 2014-05-06 ENCOUNTER — Inpatient Hospital Stay (HOSPITAL_COMMUNITY): Admission: RE | Admit: 2014-05-06 | Payer: Medicaid Other | Source: Ambulatory Visit

## 2014-09-07 ENCOUNTER — Encounter (HOSPITAL_COMMUNITY): Payer: Self-pay | Admitting: Student

## 2017-08-10 DIAGNOSIS — R300 Dysuria: Secondary | ICD-10-CM | POA: Diagnosis not present

## 2017-08-16 DIAGNOSIS — H52223 Regular astigmatism, bilateral: Secondary | ICD-10-CM | POA: Diagnosis not present

## 2017-10-18 DIAGNOSIS — Z6821 Body mass index (BMI) 21.0-21.9, adult: Secondary | ICD-10-CM | POA: Diagnosis not present

## 2017-10-18 DIAGNOSIS — Z01419 Encounter for gynecological examination (general) (routine) without abnormal findings: Secondary | ICD-10-CM | POA: Diagnosis not present

## 2017-12-12 DIAGNOSIS — N870 Mild cervical dysplasia: Secondary | ICD-10-CM | POA: Diagnosis not present

## 2017-12-12 DIAGNOSIS — R87612 Low grade squamous intraepithelial lesion on cytologic smear of cervix (LGSIL): Secondary | ICD-10-CM | POA: Diagnosis not present

## 2018-02-11 DIAGNOSIS — R1084 Generalized abdominal pain: Secondary | ICD-10-CM | POA: Diagnosis not present

## 2018-02-11 DIAGNOSIS — R1031 Right lower quadrant pain: Secondary | ICD-10-CM | POA: Diagnosis not present

## 2018-02-11 DIAGNOSIS — R109 Unspecified abdominal pain: Secondary | ICD-10-CM | POA: Diagnosis not present

## 2018-02-11 DIAGNOSIS — B9689 Other specified bacterial agents as the cause of diseases classified elsewhere: Secondary | ICD-10-CM | POA: Diagnosis not present

## 2018-02-11 DIAGNOSIS — R1032 Left lower quadrant pain: Secondary | ICD-10-CM | POA: Diagnosis not present

## 2018-02-11 DIAGNOSIS — N760 Acute vaginitis: Secondary | ICD-10-CM | POA: Diagnosis not present

## 2018-02-12 DIAGNOSIS — N3000 Acute cystitis without hematuria: Secondary | ICD-10-CM | POA: Diagnosis not present

## 2018-02-12 DIAGNOSIS — N739 Female pelvic inflammatory disease, unspecified: Secondary | ICD-10-CM | POA: Diagnosis not present

## 2018-02-27 DIAGNOSIS — R87810 Cervical high risk human papillomavirus (HPV) DNA test positive: Secondary | ICD-10-CM | POA: Diagnosis not present

## 2018-03-22 DIAGNOSIS — R300 Dysuria: Secondary | ICD-10-CM | POA: Diagnosis not present

## 2018-04-02 DIAGNOSIS — Z1339 Encounter for screening examination for other mental health and behavioral disorders: Secondary | ICD-10-CM | POA: Diagnosis not present

## 2018-04-02 DIAGNOSIS — Z1331 Encounter for screening for depression: Secondary | ICD-10-CM | POA: Diagnosis not present

## 2018-04-02 DIAGNOSIS — R87612 Low grade squamous intraepithelial lesion on cytologic smear of cervix (LGSIL): Secondary | ICD-10-CM | POA: Diagnosis not present

## 2018-04-02 DIAGNOSIS — R87619 Unspecified abnormal cytological findings in specimens from cervix uteri: Secondary | ICD-10-CM | POA: Diagnosis not present

## 2018-07-22 DIAGNOSIS — Z23 Encounter for immunization: Secondary | ICD-10-CM | POA: Diagnosis not present

## 2018-08-08 DIAGNOSIS — Z0000 Encounter for general adult medical examination without abnormal findings: Secondary | ICD-10-CM | POA: Diagnosis not present

## 2018-08-08 DIAGNOSIS — R87610 Atypical squamous cells of undetermined significance on cytologic smear of cervix (ASC-US): Secondary | ICD-10-CM | POA: Diagnosis not present

## 2018-08-08 DIAGNOSIS — Z01419 Encounter for gynecological examination (general) (routine) without abnormal findings: Secondary | ICD-10-CM | POA: Diagnosis not present

## 2018-08-08 DIAGNOSIS — N3000 Acute cystitis without hematuria: Secondary | ICD-10-CM | POA: Diagnosis not present

## 2018-08-12 DIAGNOSIS — Z23 Encounter for immunization: Secondary | ICD-10-CM | POA: Diagnosis not present

## 2018-08-22 DIAGNOSIS — R87612 Low grade squamous intraepithelial lesion on cytologic smear of cervix (LGSIL): Secondary | ICD-10-CM | POA: Diagnosis not present

## 2018-09-20 DIAGNOSIS — Z23 Encounter for immunization: Secondary | ICD-10-CM | POA: Diagnosis not present

## 2018-12-03 DIAGNOSIS — N911 Secondary amenorrhea: Secondary | ICD-10-CM | POA: Diagnosis not present

## 2018-12-12 DIAGNOSIS — Z3480 Encounter for supervision of other normal pregnancy, unspecified trimester: Secondary | ICD-10-CM | POA: Diagnosis not present

## 2018-12-12 DIAGNOSIS — Z3685 Encounter for antenatal screening for Streptococcus B: Secondary | ICD-10-CM | POA: Diagnosis not present

## 2018-12-12 DIAGNOSIS — Z3481 Encounter for supervision of other normal pregnancy, first trimester: Secondary | ICD-10-CM | POA: Diagnosis not present

## 2018-12-12 DIAGNOSIS — Z124 Encounter for screening for malignant neoplasm of cervix: Secondary | ICD-10-CM | POA: Diagnosis not present

## 2018-12-12 DIAGNOSIS — Z331 Pregnant state, incidental: Secondary | ICD-10-CM | POA: Diagnosis not present

## 2018-12-12 DIAGNOSIS — Z3482 Encounter for supervision of other normal pregnancy, second trimester: Secondary | ICD-10-CM | POA: Diagnosis not present

## 2018-12-12 DIAGNOSIS — Z113 Encounter for screening for infections with a predominantly sexual mode of transmission: Secondary | ICD-10-CM | POA: Diagnosis not present

## 2018-12-12 DIAGNOSIS — Z3400 Encounter for supervision of normal first pregnancy, unspecified trimester: Secondary | ICD-10-CM | POA: Diagnosis not present

## 2018-12-12 LAB — OB RESULTS CONSOLE RPR: RPR: NONREACTIVE

## 2018-12-12 LAB — OB RESULTS CONSOLE GC/CHLAMYDIA
Chlamydia: NEGATIVE
Gonorrhea: NEGATIVE

## 2018-12-12 LAB — OB RESULTS CONSOLE ABO/RH: RH Type: POSITIVE

## 2018-12-12 LAB — OB RESULTS CONSOLE HEPATITIS B SURFACE ANTIGEN: Hepatitis B Surface Ag: NEGATIVE

## 2018-12-12 LAB — OB RESULTS CONSOLE RUBELLA ANTIBODY, IGM: Rubella: IMMUNE

## 2018-12-12 LAB — OB RESULTS CONSOLE ANTIBODY SCREEN: Antibody Screen: NEGATIVE

## 2018-12-12 LAB — OB RESULTS CONSOLE HIV ANTIBODY (ROUTINE TESTING): HIV: NONREACTIVE

## 2018-12-16 DIAGNOSIS — B373 Candidiasis of vulva and vagina: Secondary | ICD-10-CM | POA: Diagnosis not present

## 2018-12-26 DIAGNOSIS — Z113 Encounter for screening for infections with a predominantly sexual mode of transmission: Secondary | ICD-10-CM | POA: Diagnosis not present

## 2018-12-26 DIAGNOSIS — Z331 Pregnant state, incidental: Secondary | ICD-10-CM | POA: Diagnosis not present

## 2018-12-26 DIAGNOSIS — Z124 Encounter for screening for malignant neoplasm of cervix: Secondary | ICD-10-CM | POA: Diagnosis not present

## 2018-12-26 DIAGNOSIS — Z3400 Encounter for supervision of normal first pregnancy, unspecified trimester: Secondary | ICD-10-CM | POA: Diagnosis not present

## 2019-01-09 DIAGNOSIS — Z3A12 12 weeks gestation of pregnancy: Secondary | ICD-10-CM | POA: Diagnosis not present

## 2019-01-09 DIAGNOSIS — Z3401 Encounter for supervision of normal first pregnancy, first trimester: Secondary | ICD-10-CM | POA: Diagnosis not present

## 2019-01-09 DIAGNOSIS — Z3682 Encounter for antenatal screening for nuchal translucency: Secondary | ICD-10-CM | POA: Diagnosis not present

## 2019-02-28 DIAGNOSIS — Z3A19 19 weeks gestation of pregnancy: Secondary | ICD-10-CM | POA: Diagnosis not present

## 2019-02-28 DIAGNOSIS — Z363 Encounter for antenatal screening for malformations: Secondary | ICD-10-CM | POA: Diagnosis not present

## 2019-03-27 DIAGNOSIS — Z3A23 23 weeks gestation of pregnancy: Secondary | ICD-10-CM | POA: Diagnosis not present

## 2019-03-27 DIAGNOSIS — Z362 Encounter for other antenatal screening follow-up: Secondary | ICD-10-CM | POA: Diagnosis not present

## 2019-04-25 DIAGNOSIS — Z23 Encounter for immunization: Secondary | ICD-10-CM | POA: Diagnosis not present

## 2019-04-25 DIAGNOSIS — Z3480 Encounter for supervision of other normal pregnancy, unspecified trimester: Secondary | ICD-10-CM | POA: Diagnosis not present

## 2019-05-14 DIAGNOSIS — Z20828 Contact with and (suspected) exposure to other viral communicable diseases: Secondary | ICD-10-CM | POA: Diagnosis not present

## 2019-05-23 DIAGNOSIS — Z20828 Contact with and (suspected) exposure to other viral communicable diseases: Secondary | ICD-10-CM | POA: Diagnosis not present

## 2019-06-24 DIAGNOSIS — Z3480 Encounter for supervision of other normal pregnancy, unspecified trimester: Secondary | ICD-10-CM | POA: Diagnosis not present

## 2019-06-24 DIAGNOSIS — Z3685 Encounter for antenatal screening for Streptococcus B: Secondary | ICD-10-CM | POA: Diagnosis not present

## 2019-06-24 LAB — OB RESULTS CONSOLE GBS: GBS: POSITIVE

## 2019-07-10 ENCOUNTER — Telehealth (HOSPITAL_COMMUNITY): Payer: Self-pay | Admitting: *Deleted

## 2019-07-10 ENCOUNTER — Encounter (HOSPITAL_COMMUNITY): Payer: Self-pay | Admitting: *Deleted

## 2019-07-10 NOTE — Telephone Encounter (Signed)
Preadmission screen  

## 2019-07-11 ENCOUNTER — Telehealth (HOSPITAL_COMMUNITY): Payer: Self-pay | Admitting: *Deleted

## 2019-07-11 NOTE — Telephone Encounter (Signed)
Preadmission screen  

## 2019-07-15 ENCOUNTER — Telehealth (HOSPITAL_COMMUNITY): Payer: Self-pay | Admitting: *Deleted

## 2019-07-15 NOTE — Telephone Encounter (Signed)
Preadmission screen  

## 2019-07-16 ENCOUNTER — Telehealth (HOSPITAL_COMMUNITY): Payer: Self-pay | Admitting: *Deleted

## 2019-07-16 ENCOUNTER — Encounter (HOSPITAL_COMMUNITY): Payer: Self-pay | Admitting: *Deleted

## 2019-07-16 NOTE — Telephone Encounter (Signed)
Preadmission screen  

## 2019-07-21 ENCOUNTER — Other Ambulatory Visit (HOSPITAL_COMMUNITY)
Admission: RE | Admit: 2019-07-21 | Discharge: 2019-07-21 | Disposition: A | Discharge status: Home / Self Care | Payer: BC Managed Care – PPO | Source: Ambulatory Visit

## 2019-07-21 ENCOUNTER — Encounter (HOSPITAL_COMMUNITY)
Admission: AD | Disposition: A | Discharge status: Home / Self Care | Payer: Self-pay | Source: Home / Self Care | Attending: Obstetrics and Gynecology

## 2019-07-21 ENCOUNTER — Inpatient Hospital Stay (HOSPITAL_COMMUNITY)
Admission: AD | Admit: 2019-07-21 | Discharge: 2019-07-24 | DRG: 785 | Disposition: A | Discharge status: Home / Self Care | Payer: BC Managed Care – PPO | Attending: Obstetrics and Gynecology | Admitting: Obstetrics and Gynecology

## 2019-07-21 ENCOUNTER — Inpatient Hospital Stay (HOSPITAL_COMMUNITY): Payer: BC Managed Care – PPO | Admitting: Anesthesiology

## 2019-07-21 ENCOUNTER — Encounter (HOSPITAL_COMMUNITY): Payer: Self-pay | Admitting: *Deleted

## 2019-07-21 ENCOUNTER — Other Ambulatory Visit: Payer: Self-pay

## 2019-07-21 DIAGNOSIS — Z20828 Contact with and (suspected) exposure to other viral communicable diseases: Secondary | ICD-10-CM | POA: Diagnosis not present

## 2019-07-21 DIAGNOSIS — Z23 Encounter for immunization: Secondary | ICD-10-CM | POA: Diagnosis not present

## 2019-07-21 DIAGNOSIS — Z3A40 40 weeks gestation of pregnancy: Secondary | ICD-10-CM

## 2019-07-21 DIAGNOSIS — Z302 Encounter for sterilization: Secondary | ICD-10-CM | POA: Diagnosis not present

## 2019-07-21 DIAGNOSIS — O26893 Other specified pregnancy related conditions, third trimester: Secondary | ICD-10-CM | POA: Diagnosis not present

## 2019-07-21 DIAGNOSIS — O480 Post-term pregnancy: Secondary | ICD-10-CM | POA: Diagnosis not present

## 2019-07-21 DIAGNOSIS — Z98891 History of uterine scar from previous surgery: Secondary | ICD-10-CM

## 2019-07-21 DIAGNOSIS — O99824 Streptococcus B carrier state complicating childbirth: Principal | ICD-10-CM | POA: Diagnosis present

## 2019-07-21 DIAGNOSIS — Q825 Congenital non-neoplastic nevus: Secondary | ICD-10-CM | POA: Diagnosis not present

## 2019-07-21 LAB — TYPE AND SCREEN
ABO/RH(D): AB POS
Antibody Screen: NEGATIVE

## 2019-07-21 LAB — ABO/RH: ABO/RH(D): AB POS

## 2019-07-21 LAB — CBC
HCT: 37.8 % (ref 36.0–46.0)
Hemoglobin: 12.3 g/dL (ref 12.0–15.0)
MCH: 29.2 pg (ref 26.0–34.0)
MCHC: 32.5 g/dL (ref 30.0–36.0)
MCV: 89.8 fL (ref 80.0–100.0)
Platelets: 252 10*3/uL (ref 150–400)
RBC: 4.21 MIL/uL (ref 3.87–5.11)
RDW: 13.3 % (ref 11.5–15.5)
WBC: 11.6 10*3/uL — ABNORMAL HIGH (ref 4.0–10.5)
nRBC: 0 % (ref 0.0–0.2)

## 2019-07-21 LAB — POCT FERN TEST: POCT Fern Test: NEGATIVE

## 2019-07-21 LAB — SARS CORONAVIRUS 2 BY RT PCR (HOSPITAL ORDER, PERFORMED IN ~~LOC~~ HOSPITAL LAB): SARS Coronavirus 2: NEGATIVE

## 2019-07-21 LAB — RPR: RPR Ser Ql: NONREACTIVE

## 2019-07-21 SURGERY — Surgical Case
Anesthesia: Spinal | Laterality: Bilateral | Wound class: Clean Contaminated

## 2019-07-21 MED ORDER — PHENYLEPHRINE HCL-NACL 20-0.9 MG/250ML-% IV SOLN
INTRAVENOUS | Status: DC | PRN
  Administered 2019-07-21: 60 ug/min via INTRAVENOUS

## 2019-07-21 MED ORDER — OXYTOCIN 40 UNITS IN NORMAL SALINE INFUSION - SIMPLE MED
1.0000 m[IU]/min | INTRAVENOUS | Status: DC
  Administered 2019-07-21: 2 m[IU]/min via INTRAVENOUS

## 2019-07-21 MED ORDER — LACTATED RINGERS IV SOLN: 500.0000 mL | INTRAVENOUS | Status: DC | PRN

## 2019-07-21 MED ORDER — ONDANSETRON HCL 4 MG/2ML IJ SOLN
INTRAMUSCULAR | Status: DC | PRN
  Administered 2019-07-21: 4 mg via INTRAVENOUS

## 2019-07-21 MED ORDER — LACTATED RINGERS IV SOLN
INTRAVENOUS | Status: DC
  Administered 2019-07-21 – 2019-07-22 (×4): via INTRAVENOUS

## 2019-07-21 MED ORDER — OXYTOCIN BOLUS FROM INFUSION: 500.0000 mL | Freq: Once | INTRAVENOUS | Status: DC

## 2019-07-21 MED ORDER — LIDOCAINE HCL (PF) 1 % IJ SOLN: 30.0000 mL | INTRAMUSCULAR | Status: DC | PRN

## 2019-07-21 MED ORDER — ONDANSETRON HCL 4 MG/2ML IJ SOLN: 4.0000 mg | Freq: Four times a day (QID) | INTRAMUSCULAR | Status: DC | PRN

## 2019-07-21 MED ORDER — OXYTOCIN 40 UNITS IN NORMAL SALINE INFUSION - SIMPLE MED
INTRAVENOUS | Status: AC
  Filled 2019-07-21: qty 1000

## 2019-07-21 MED ORDER — TERBUTALINE SULFATE 1 MG/ML IJ SOLN: 0.2500 mg | Freq: Once | INTRAMUSCULAR | Status: DC | PRN

## 2019-07-21 MED ORDER — DEXAMETHASONE SODIUM PHOSPHATE 10 MG/ML IJ SOLN
INTRAMUSCULAR | Status: AC
  Filled 2019-07-21: qty 1

## 2019-07-21 MED ORDER — FENTANYL CITRATE (PF) 100 MCG/2ML IJ SOLN
INTRAMUSCULAR | Status: AC
  Filled 2019-07-21: qty 2

## 2019-07-21 MED ORDER — FENTANYL CITRATE (PF) 100 MCG/2ML IJ SOLN
INTRAMUSCULAR | Status: DC | PRN
  Administered 2019-07-21: 15 ug via INTRATHECAL

## 2019-07-21 MED ORDER — OXYCODONE-ACETAMINOPHEN 5-325 MG PO TABS: 1.0000 | ORAL_TABLET | ORAL | Status: DC | PRN

## 2019-07-21 MED ORDER — MORPHINE SULFATE (PF) 0.5 MG/ML IJ SOLN
INTRAMUSCULAR | Status: AC
  Filled 2019-07-21: qty 10

## 2019-07-21 MED ORDER — OXYTOCIN 40 UNITS IN NORMAL SALINE INFUSION - SIMPLE MED: 2.5000 [IU]/h | INTRAVENOUS | Status: DC

## 2019-07-21 MED ORDER — PHENYLEPHRINE HCL-NACL 20-0.9 MG/250ML-% IV SOLN
INTRAVENOUS | Status: AC
  Filled 2019-07-21: qty 250

## 2019-07-21 MED ORDER — ACETAMINOPHEN 325 MG PO TABS: 650.0000 mg | ORAL_TABLET | ORAL | Status: DC | PRN

## 2019-07-21 MED ORDER — OXYCODONE-ACETAMINOPHEN 5-325 MG PO TABS: 2.0000 | ORAL_TABLET | ORAL | Status: DC | PRN

## 2019-07-21 MED ORDER — PENICILLIN G 3 MILLION UNITS IVPB - SIMPLE MED
3.0000 10*6.[IU] | INTRAVENOUS | Status: DC
  Administered 2019-07-21 (×3): 3 10*6.[IU] via INTRAVENOUS
  Filled 2019-07-21 (×3): qty 100

## 2019-07-21 MED ORDER — FLEET ENEMA 7-19 GM/118ML RE ENEM: 1.0000 | ENEMA | RECTAL | Status: DC | PRN

## 2019-07-21 MED ORDER — FENTANYL CITRATE (PF) 100 MCG/2ML IJ SOLN
50.0000 ug | INTRAMUSCULAR | Status: DC | PRN
  Administered 2019-07-21: 100 ug via INTRAVENOUS
  Filled 2019-07-21: qty 2

## 2019-07-21 MED ORDER — ONDANSETRON HCL 4 MG/2ML IJ SOLN
INTRAMUSCULAR | Status: AC
  Filled 2019-07-21: qty 2

## 2019-07-21 MED ORDER — CEFAZOLIN SODIUM-DEXTROSE 2-3 GM-%(50ML) IV SOLR
INTRAVENOUS | Status: DC | PRN
  Administered 2019-07-21: 2 g via INTRAVENOUS

## 2019-07-21 MED ORDER — SODIUM CHLORIDE 0.9 % IV SOLN
5.0000 10*6.[IU] | Freq: Once | INTRAVENOUS | Status: AC
  Administered 2019-07-21: 5 10*6.[IU] via INTRAVENOUS
  Filled 2019-07-21: qty 5

## 2019-07-21 MED ORDER — SOD CITRATE-CITRIC ACID 500-334 MG/5ML PO SOLN
30.0000 mL | ORAL | Status: DC | PRN
  Administered 2019-07-21: 30 mL via ORAL
  Filled 2019-07-21: qty 30

## 2019-07-21 MED ORDER — BUPIVACAINE IN DEXTROSE 0.75-8.25 % IT SOLN
INTRATHECAL | Status: DC | PRN
  Administered 2019-07-21: 1.6 mL via INTRATHECAL

## 2019-07-21 MED ORDER — MORPHINE SULFATE (PF) 0.5 MG/ML IJ SOLN
INTRAMUSCULAR | Status: DC | PRN
  Administered 2019-07-21: .15 mg via INTRATHECAL

## 2019-07-21 SURGICAL SUPPLY — 37 items
ADH SKN CLS APL DERMABOND .7 (GAUZE/BANDAGES/DRESSINGS)
APL SKNCLS STERI-STRIP NONHPOA (GAUZE/BANDAGES/DRESSINGS) ×2
BENZOIN TINCTURE PRP APPL 2/3 (GAUZE/BANDAGES/DRESSINGS) ×2 IMPLANT
CHLORAPREP W/TINT 26ML (MISCELLANEOUS) ×3 IMPLANT
CLAMP CORD UMBIL (MISCELLANEOUS) IMPLANT
CLOTH BEACON ORANGE TIMEOUT ST (SAFETY) ×3 IMPLANT
DERMABOND ADVANCED (GAUZE/BANDAGES/DRESSINGS)
DERMABOND ADVANCED .7 DNX12 (GAUZE/BANDAGES/DRESSINGS) IMPLANT
DRSG OPSITE POSTOP 4X10 (GAUZE/BANDAGES/DRESSINGS) ×3 IMPLANT
ELECT REM PT RETURN 9FT ADLT (ELECTROSURGICAL) ×3
ELECTRODE REM PT RTRN 9FT ADLT (ELECTROSURGICAL) ×2 IMPLANT
EXTRACTOR VACUUM M CUP 4 TUBE (SUCTIONS) IMPLANT
GLOVE BIO SURGEON STRL SZ7.5 (GLOVE) ×3 IMPLANT
GLOVE BIOGEL PI IND STRL 7.0 (GLOVE) ×2 IMPLANT
GLOVE BIOGEL PI INDICATOR 7.0 (GLOVE) ×1
GOWN STRL REUS W/TWL LRG LVL3 (GOWN DISPOSABLE) ×6 IMPLANT
KIT ABG SYR 3ML LUER SLIP (SYRINGE) ×3 IMPLANT
NDL HYPO 25X5/8 SAFETYGLIDE (NEEDLE) ×1 IMPLANT
NEEDLE HYPO 25X5/8 SAFETYGLIDE (NEEDLE) ×3 IMPLANT
NS IRRIG 1000ML POUR BTL (IV SOLUTION) ×3 IMPLANT
PACK C SECTION WH (CUSTOM PROCEDURE TRAY) ×3 IMPLANT
PAD ABD 7.5X8 STRL (GAUZE/BANDAGES/DRESSINGS) ×2 IMPLANT
PAD OB MATERNITY 4.3X12.25 (PERSONAL CARE ITEMS) ×3 IMPLANT
PENCIL SMOKE EVAC W/HOLSTER (ELECTROSURGICAL) ×3 IMPLANT
SPONGE GAUZE 4X4 12PLY STER LF (GAUZE/BANDAGES/DRESSINGS) ×4 IMPLANT
STRIP CLOSURE SKIN 1/2X4 (GAUZE/BANDAGES/DRESSINGS) ×2 IMPLANT
SUT MNCRL 0 VIOLET CTX 36 (SUTURE) ×8 IMPLANT
SUT MONOCRYL 0 CTX 36 (SUTURE) ×4
SUT PDS AB 0 CTX 60 (SUTURE) ×3 IMPLANT
SUT PLAIN 0 NONE (SUTURE) ×2 IMPLANT
SUT PLAIN 2 0 (SUTURE) ×3
SUT PLAIN 2 0 XLH (SUTURE) IMPLANT
SUT PLAIN ABS 2-0 CT1 27XMFL (SUTURE) ×1 IMPLANT
SUT VIC AB 4-0 KS 27 (SUTURE) ×3 IMPLANT
TOWEL OR 17X24 6PK STRL BLUE (TOWEL DISPOSABLE) ×3 IMPLANT
TRAY FOLEY W/BAG SLVR 14FR LF (SET/KITS/TRAYS/PACK) ×3 IMPLANT
WATER STERILE IRR 1000ML POUR (IV SOLUTION) ×5 IMPLANT

## 2019-07-21 NOTE — Progress Notes (Signed)
Pt standing at [facility], c/o back pain, run guiding pt through hip compressions and lunges

## 2019-07-21 NOTE — Anesthesia Preprocedure Evaluation (Addendum)
Anesthesia Evaluation  Patient identified by MRN, date of birth, ID band Patient awake    Reviewed: Allergy & Precautions, NPO status , Patient's Chart, lab work & pertinent test results  Airway Mallampati: II       Dental no notable dental hx.    Pulmonary neg pulmonary ROS,    Pulmonary exam normal        Cardiovascular negative cardio ROS Normal cardiovascular exam     Neuro/Psych negative neurological ROS     GI/Hepatic Neg liver ROS,   Endo/Other  negative endocrine ROS  Renal/GU      Musculoskeletal   Abdominal   Peds  Hematology   Anesthesia Other Findings   Reproductive/Obstetrics (+) Pregnancy                             Anesthesia Physical Anesthesia Plan  ASA: II  Anesthesia Plan: Spinal   Post-op Pain Management:    Induction:   PONV Risk Score and Plan: 1 and Ondansetron  Airway Management Planned: Natural Airway  Additional Equipment: None  Intra-op Plan:   Post-operative Plan:   Informed Consent: I have reviewed the patients History and Physical, chart, labs and discussed the procedure including the risks, benefits and alternatives for the proposed anesthesia with the patient or authorized representative who has indicated his/her understanding and acceptance.       Plan Discussed with: CRNA  Anesthesia Plan Comments: (Lab Results      Component                Value               Date                      WBC                      11.6 (H)            07/21/2019                HGB                      12.3                07/21/2019                HCT                      37.8                07/21/2019                MCV                      89.8                07/21/2019                PLT                      252                 07/21/2019            COVID-19 Labs  No results for input(s): DDIMER, FERRITIN, LDH, CRP in the last 72 hours.  Lab  Results  Component                Value               Date                      [id number]              NEGATIVE            07/21/2019            )        Anesthesia Quick Evaluation

## 2019-07-21 NOTE — Progress Notes (Signed)
Pt in bed, hands and knees position, RN doing hip squeezes

## 2019-07-21 NOTE — Anesthesia Procedure Notes (Signed)
Spinal  Start time: 07/21/2019 11:41 PM End time: 07/21/2019 11:43 PM Staffing Anesthesiologist: Effie Berkshire, MD Performed: anesthesiologist  Preanesthetic Checklist Completed: patient identified, site marked, surgical consent, pre-op evaluation, timeout performed, IV checked, risks and benefits discussed and monitors and equipment checked Spinal Block Patient position: sitting Prep: site prepped and draped and DuraPrep Location: L3-4 Injection technique: single-shot Needle Needle type: Pencan  Needle gauge: 24 G Needle length: 10 cm Needle insertion depth: 10 cm Additional Notes Patient tolerated well. No immediate complications.

## 2019-07-21 NOTE — MAU Provider Note (Signed)
S: Ms. Breuna Loveall is a 28 y.o. G2P1001 at [redacted]w[redacted]d  who presents to MAU today complaining of leaking of fluid since she had a spot of discharge on her sheet this morning. No active LOF. She denies vaginal bleeding. She endorses contractions. She reports normal fetal movement.    O: BP 119/76   Pulse 75   Temp 97.8 F (36.6 C) (Oral)   Resp 18   Ht 5\' 4"  (1.626 m)   Wt 82.1 kg   LMP 10/12/2018   SpO2 100%   BMI 31.09 kg/m  GENERAL: Well-developed, well-nourished female in no acute distress.  HEAD: Normocephalic, atraumatic.  CHEST: Normal effort of breathing, regular heart rate ABDOMEN: Soft, nontender, gravid PELVIC: Normal external female genitalia. Vagina is pink and rugated. Cervix with normal contour, no lesions. Normal discharge.  No pooling.   Cervical exam:  Dilation: 1 Station: -2 Exam by:: Truitt Leep, RNC   Fetal Monitoring: Baseline: 135 Variability: mod Accelerations: present Decelerations: absent Contractions: q 3  Results for orders placed or performed during the hospital encounter of 07/21/19 (from the past 24 hour(s))  POCT fern test     Status: None   Collection Time: 07/21/19  8:51 AM  Result Value Ref Range   POCT Fern Test Negative = intact amniotic membranes   Type and screen [place]     Status: None (Preliminary result)   Collection Time: 07/21/19  9:14 AM  Result Value Ref Range   ABO/RH(D) PENDING    Antibody Screen PENDING    Sample Expiration      07/24/2019,2359 Performed at [facility], [place] [address], [place], [place] [address]      A: SIUP at [redacted]w[redacted]d  Membranes intact  P: Report given to RN to contact MD on call for further instructions  Starr Lake, CNM 07/21/2019 10:03 AM

## 2019-07-21 NOTE — Progress Notes (Signed)
FHT cat one Cs 3/90/-2/vtx AROM clear FHT cat one

## 2019-07-21 NOTE — Progress Notes (Signed)
Cx 5/puffy/-2 no change in about 5 hours Labor is adequate  Rec: C/S-D/W risks including infection, organ damage, bleeding/transfusion-HIV/Hep, DVT/PE, pneumonia. Patient wants BTL. D/W patient permanence, failure rate, increased ectopic risk. She is adamant she wants this and understands permanence.

## 2019-07-21 NOTE — MAU Note (Signed)
Presents with ctxs every 5 minutes apart, began @ 0515 this morning.  Unsure if LOF, denies VB.  Endorses +FM.

## 2019-07-21 NOTE — H&P (Signed)
[name] is a 28 y.o. female presenting for UCs. Pregnancy uncomplicated. OB History    Gravida  2   Para  1   Term  1   Preterm      AB      Living  1     SAB      TAB      Ectopic      Multiple      Live Births  1          Past Medical History:  Diagnosis Date  . Medical history non-contributory    Past Surgical History:  Procedure Laterality Date  . NO PAST SURGERIES     Family History: family history includes Diabetes in her maternal grandmother; Hypertension in her mother; Prostate cancer in her maternal grandfather. Social History:  reports that she has never smoked. She has never used smokeless tobacco. She reports previous alcohol use. She reports that she does not use drugs.     Maternal Diabetes: No Genetic Screening: Normal Maternal Ultrasounds/Referrals: Normal Fetal Ultrasounds or other Referrals:  None Maternal Substance Abuse:  No Significant Maternal Medications:  None Significant Maternal Lab Results: GBBS positive Other Comments:  None  Review of Systems  Eyes: Negative for blurred vision.  Gastrointestinal: Negative for abdominal pain.  Neurological: Negative for headaches.   Maternal Medical History:  Reason for admission: Contractions.   Contractions: Onset was 3-5 hours ago.    Fetal activity: Perceived fetal activity is normal.      Dilation: 1 Station: -2 Exam by:: F. Morris, RNC Blood pressure 118/76, pulse 85, temperature 97.7 F (36.5 C), temperature source Oral, resp. rate 18, height 5\' 4"  (1.626 m), weight 82.1 kg, last menstrual period 10/12/2018, SpO2 100 %. Maternal Exam:  Abdomen: Fetal presentation: vertex     Fetal Exam Fetal State Assessment: Category I - tracings are normal.     Physical Exam  Cardiovascular: Normal rate and regular rhythm.  Respiratory: Effort normal and breath sounds normal.  GI: Soft.  Neurological: She has normal reflexes.    Cs 2-3/90/-2/vtx  Prenatal labs: ABO, Rh:  AB/Positive/-- (02/06 0000) Antibody: Negative (02/06 0000) Rubella: Immune (02/06 0000) RPR: Nonreactive (02/06 0000)  HBsAg: Negative (02/06 0000)  HIV: Non-reactive (02/06 0000)  GBS: Positive/-- (08/18 0000)   Assessment/Plan: 28 yo G2P1 @ 40 2/7 Entering labor GBBS ATB ordered Pitocin prn   Shon Millet II 07/21/2019, 9:46 AM

## 2019-07-21 NOTE — Progress Notes (Signed)
Pt up to BR

## 2019-07-22 ENCOUNTER — Encounter (HOSPITAL_COMMUNITY): Payer: Self-pay

## 2019-07-22 LAB — CBC
HCT: 28.6 % — ABNORMAL LOW (ref 36.0–46.0)
Hemoglobin: 10 g/dL — ABNORMAL LOW (ref 12.0–15.0)
MCH: 30.5 pg (ref 26.0–34.0)
MCHC: 35 g/dL (ref 30.0–36.0)
MCV: 87.2 fL (ref 80.0–100.0)
Platelets: 242 10*3/uL (ref 150–400)
RBC: 3.28 MIL/uL — ABNORMAL LOW (ref 3.87–5.11)
RDW: 13.4 % (ref 11.5–15.5)
WBC: 31.3 10*3/uL — ABNORMAL HIGH (ref 4.0–10.5)
nRBC: 0 % (ref 0.0–0.2)

## 2019-07-22 MED ORDER — NALBUPHINE SYRINGE 5 MG/0.5 ML
5.0000 mg | INJECTION | INTRAMUSCULAR | Status: DC | PRN
  Filled 2019-07-22: qty 0.5

## 2019-07-22 MED ORDER — FENTANYL CITRATE (PF) 100 MCG/2ML IJ SOLN: 25.0000 ug | INTRAMUSCULAR | Status: DC | PRN

## 2019-07-22 MED ORDER — DEXAMETHASONE SODIUM PHOSPHATE 10 MG/ML IJ SOLN
INTRAMUSCULAR | Status: DC | PRN
  Administered 2019-07-22: 10 mg via INTRAVENOUS

## 2019-07-22 MED ORDER — SODIUM CHLORIDE 0.9 % IR SOLN
Status: DC | PRN
  Administered 2019-07-22 (×2): 1

## 2019-07-22 MED ORDER — ONDANSETRON HCL 4 MG/2ML IJ SOLN: 4.0000 mg | Freq: Three times a day (TID) | INTRAMUSCULAR | Status: DC | PRN

## 2019-07-22 MED ORDER — NALBUPHINE SYRINGE 5 MG/0.5 ML
5.0000 mg | INJECTION | Freq: Once | INTRAMUSCULAR | Status: DC | PRN
  Filled 2019-07-22: qty 0.5

## 2019-07-22 MED ORDER — COCONUT OIL OIL
1.0000 "application " | TOPICAL_OIL | Status: DC | PRN
  Administered 2019-07-23: 1 via TOPICAL

## 2019-07-22 MED ORDER — MEPERIDINE HCL 25 MG/ML IJ SOLN: 6.2500 mg | INTRAMUSCULAR | Status: DC | PRN

## 2019-07-22 MED ORDER — SCOPOLAMINE 1 MG/3DAYS TD PT72: 1.0000 | MEDICATED_PATCH | Freq: Once | TRANSDERMAL | Status: DC

## 2019-07-22 MED ORDER — ACETAMINOPHEN 160 MG/5ML PO SOLN: 325.0000 mg | Freq: Once | ORAL | Status: DC | PRN

## 2019-07-22 MED ORDER — IBUPROFEN 600 MG PO TABS
600.0000 mg | ORAL_TABLET | Freq: Four times a day (QID) | ORAL | Status: DC | PRN
  Administered 2019-07-22 – 2019-07-24 (×6): 600 mg via ORAL
  Filled 2019-07-22 (×6): qty 1

## 2019-07-22 MED ORDER — NALOXONE HCL 0.4 MG/ML IJ SOLN: 0.4000 mg | INTRAMUSCULAR | Status: DC | PRN

## 2019-07-22 MED ORDER — KETOROLAC TROMETHAMINE 30 MG/ML IJ SOLN
INTRAMUSCULAR | Status: AC
  Filled 2019-07-22: qty 1

## 2019-07-22 MED ORDER — DIPHENHYDRAMINE HCL 25 MG PO CAPS: 25.0000 mg | ORAL_CAPSULE | ORAL | Status: DC | PRN

## 2019-07-22 MED ORDER — SIMETHICONE 80 MG PO CHEW: 80.0000 mg | CHEWABLE_TABLET | ORAL | Status: DC | PRN

## 2019-07-22 MED ORDER — SIMETHICONE 80 MG PO CHEW
80.0000 mg | CHEWABLE_TABLET | ORAL | Status: DC
  Administered 2019-07-23 (×2): 80 mg via ORAL
  Filled 2019-07-22 (×2): qty 1

## 2019-07-22 MED ORDER — ACETAMINOPHEN 325 MG PO TABS: 325.0000 mg | ORAL_TABLET | Freq: Once | ORAL | Status: DC | PRN

## 2019-07-22 MED ORDER — KETOROLAC TROMETHAMINE 30 MG/ML IJ SOLN
30.0000 mg | Freq: Four times a day (QID) | INTRAMUSCULAR | Status: DC | PRN
  Administered 2019-07-22: 30 mg via INTRAVENOUS

## 2019-07-22 MED ORDER — SIMETHICONE 80 MG PO CHEW
80.0000 mg | CHEWABLE_TABLET | Freq: Three times a day (TID) | ORAL | Status: DC
  Administered 2019-07-22 – 2019-07-24 (×5): 80 mg via ORAL
  Filled 2019-07-22 (×5): qty 1

## 2019-07-22 MED ORDER — SODIUM CHLORIDE 0.9% FLUSH: 3.0000 mL | INTRAVENOUS | Status: DC | PRN

## 2019-07-22 MED ORDER — NALOXONE HCL 4 MG/10ML IJ SOLN
1.0000 ug/kg/h | INTRAVENOUS | Status: DC | PRN
  Filled 2019-07-22: qty 5

## 2019-07-22 MED ORDER — DIBUCAINE (PERIANAL) 1 % EX OINT: 1.0000 "application " | TOPICAL_OINTMENT | CUTANEOUS | Status: DC | PRN

## 2019-07-22 MED ORDER — KETOROLAC TROMETHAMINE 30 MG/ML IJ SOLN: 30.0000 mg | Freq: Four times a day (QID) | INTRAMUSCULAR | Status: DC | PRN

## 2019-07-22 MED ORDER — WITCH HAZEL-GLYCERIN EX PADS: 1.0000 "application " | MEDICATED_PAD | CUTANEOUS | Status: DC | PRN

## 2019-07-22 MED ORDER — LACTATED RINGERS IV SOLN
INTRAVENOUS | Status: DC
  Administered 2019-07-22: 11:00:00 via INTRAVENOUS

## 2019-07-22 MED ORDER — DIPHENHYDRAMINE HCL 25 MG PO CAPS: 25.0000 mg | ORAL_CAPSULE | Freq: Four times a day (QID) | ORAL | Status: DC | PRN

## 2019-07-22 MED ORDER — ZOLPIDEM TARTRATE 5 MG PO TABS: 5.0000 mg | ORAL_TABLET | Freq: Every evening | ORAL | Status: DC | PRN

## 2019-07-22 MED ORDER — PRENATAL MULTIVITAMIN CH
1.0000 | ORAL_TABLET | Freq: Every day | ORAL | Status: DC
  Administered 2019-07-22 – 2019-07-23 (×2): 1 via ORAL
  Filled 2019-07-22: qty 1

## 2019-07-22 MED ORDER — SODIUM CHLORIDE 0.9 % IV SOLN
INTRAVENOUS | Status: DC | PRN
  Administered 2019-07-22: via INTRAVENOUS

## 2019-07-22 MED ORDER — OXYTOCIN 40 UNITS IN NORMAL SALINE INFUSION - SIMPLE MED: 2.5000 [IU]/h | INTRAVENOUS | Status: AC

## 2019-07-22 MED ORDER — DIPHENHYDRAMINE HCL 50 MG/ML IJ SOLN: 12.5000 mg | INTRAMUSCULAR | Status: DC | PRN

## 2019-07-22 MED ORDER — SENNOSIDES-DOCUSATE SODIUM 8.6-50 MG PO TABS
2.0000 | ORAL_TABLET | ORAL | Status: DC
  Administered 2019-07-23 (×2): 2 via ORAL
  Filled 2019-07-22 (×2): qty 2

## 2019-07-22 MED ORDER — HYDROMORPHONE HCL 1 MG/ML IJ SOLN: 0.2000 mg | INTRAMUSCULAR | Status: DC | PRN

## 2019-07-22 MED ORDER — ACETAMINOPHEN 325 MG PO TABS
650.0000 mg | ORAL_TABLET | ORAL | Status: DC | PRN
  Administered 2019-07-22 – 2019-07-23 (×3): 650 mg via ORAL
  Filled 2019-07-22 (×3): qty 2

## 2019-07-22 MED ORDER — SODIUM CHLORIDE 0.9 % IV SOLN
INTRAVENOUS | Status: DC | PRN
  Administered 2019-07-22: 40 [IU] via INTRAVENOUS

## 2019-07-22 MED ORDER — OXYCODONE HCL 5 MG PO TABS: 5.0000 mg | ORAL_TABLET | ORAL | Status: DC | PRN

## 2019-07-22 MED ORDER — TETANUS-DIPHTH-ACELL PERTUSSIS 5-2.5-18.5 LF-MCG/0.5 IM SUSP: 0.5000 mL | Freq: Once | INTRAMUSCULAR | Status: DC

## 2019-07-22 MED ORDER — ACETAMINOPHEN 10 MG/ML IV SOLN: 1000.0000 mg | Freq: Once | INTRAVENOUS | Status: DC | PRN

## 2019-07-22 MED ORDER — MENTHOL 3 MG MT LOZG: 1.0000 | LOZENGE | OROMUCOSAL | Status: DC | PRN

## 2019-07-22 MED ORDER — PROMETHAZINE HCL 25 MG/ML IJ SOLN: 6.2500 mg | INTRAMUSCULAR | Status: DC | PRN

## 2019-07-22 MED ORDER — LACTATED RINGERS IV SOLN: INTRAVENOUS | Status: DC

## 2019-07-22 NOTE — Lactation Note (Signed)
This note was copied from a baby's chart. Lactation Consultation Note  Patient Name: [name] [id number] Date: 07/22/2019 Reason for consult: Initial assessment;Term P2, 6 hour female infant. Infant has one void and one stool since birth. Per mom, she feels  breastfeeding is going well and she has breastfed infant three times since birth.  Mom is experienced at breastfeedingm she breastfed her 28 year old for one year. Per mom, she last breastfed infant at 6:15 am for 5 minutes prior to [facility] entering the room.  Infant in alert state in basinet but not cuing to breastfed at this time. Mom knows how to hand express.  Mom has DEBP at home. Mom knows to breastfed according hunger cues, 8 to 12 times within 24 hours and on demand. Mom knows to ask Nurse or [place] if she has any breastfeeding questions, concerns or need assistance with latching infant to breast.  Reviewed Baby & Me book's Breastfeeding Basics.  Mom made aware of O/P services, breastfeeding support groups, community resources, and our phone # for post-discharge questions.   Maternal Data Formula Feeding for Exclusion: No Has patient been taught Hand Expression?: Yes(Per mom, she knows how to do hand expression.) Does the patient have breastfeeding experience prior to this delivery?: Yes  Feeding Feeding Type: Breast Fed  LATCH Score Latch: Repeated attempts needed to sustain latch, nipple held in mouth throughout feeding, stimulation needed to elicit sucking reflex.  Audible Swallowing: A few with stimulation  Type of Nipple: Everted at rest and after stimulation  Comfort (Breast/Nipple): Soft / non-tender  Hold (Positioning): Assistance needed to correctly position infant at breast and maintain latch.  LATCH Score: 7  Interventions Interventions: Breast feeding basics reviewed;Skin to skin;DEBP  Lactation Tools Discussed/Used WIC Program: No   Consult Status Consult Status: Follow-up Date: 07/22/19 Follow-up  type: In-patient    Vicente Serene 07/22/2019, 6:31 AM

## 2019-07-22 NOTE — Brief Op Note (Signed)
07/21/2019  12:47 AM  PATIENT:  Harmon Pier  28 y.o. female  PRE-OPERATIVE DIAGNOSIS:  Primary unscheduled cesarean section with bilateral tubal ligation; arrest of dilation; desires sterilization  POST-OPERATIVE DIAGNOSIS:  Primary unscheduled cesarean section with bilateral tubal ligation; arrest of dilation; desires sterilization  PROCEDURE:  Procedure(s): CESAREAN SECTION WITH BILATERAL TUBAL LIGATION (Bilateral)  SURGEON:  Surgeon(s) and Role:    * Everlene Farrier, MD - Primary  PHYSICIAN ASSISTANT:   ASSISTANTS: none   ANESTHESIA:   spinal  EBL:  Per anesthesia record   BLOOD ADMINISTERED:none  DRAINS: Urinary Catheter (Foley)   LOCAL MEDICATIONS USED:  NONE  SPECIMEN:  Source of Specimen:  bilateral fallopian tube segments  DISPOSITION OF SPECIMEN:  PATHOLOGY  COUNTS:  YES  TOURNIQUET:  * No tourniquets in log *  DICTATION: .Other Dictation: Dictation Number [id number]  PLAN OF CARE: Admit to inpatient   PATIENT DISPOSITION:  PACU - hemodynamically stable.   Delay start of Pharmacological VTE agent (>24hrs) due to surgical blood loss or risk of bleeding: not applicable

## 2019-07-22 NOTE — Op Note (Signed)
[name], [name] MEDICAL RECORD [id number] ACCOUNT [id number] DATE OF BIRTH:[birth year] FACILITY: [facility] LOCATION: [place], [place]  OPERATIVE REPORT  DATE OF PROCEDURE:  07/22/2019  PREOPERATIVE DIAGNOSES: 1.  Arrest of cervical dilation. 2.  Desires permanent sterilization.  POSTOPERATIVE DIAGNOSES: 1.  Arrest of dilation. 2.  Desires permanent sterilization.  PROCEDURE:  Low transverse cesarean section and bilateral tubal ligation.  SURGEON:  Everlene Farrier, MD  ANESTHESIA:  Spinal, Dr. Greggory Keen BLOOD LOSS:  Per anesthesiology.  FINDINGS:  Viable infant, Apgars and weight pending.  INDICATIONS AND CONSENT:  This patient is a 28 year old G2 P1 at 40-3/7 weeks who presents in spontaneous labor.  Artificial rupture of membranes for clear fluid was carried out.  She was subsequently started on Pitocin.  She advanced to 5 cm.  IUPC was  placed.  Despite good labor, she remained at 5 cm for nearly 5 hours.  Cervix was puffy and the station was -2.  Recommendation for cesarean section was made.  Potential risks and complications were discussed including but not limited to infection, organ  damage, bleeding requiring transfusion of blood products with HIV and hepatitis acquisition, DVT, PE, pneumonia.  The patient also desires permanent sterilization.  The permanence of the procedure was stressed, and the patient states that she adamantly  wants tubal ligation.  Failure rate and ectopic risk were also reviewed.  The patient states she understands and agrees, and consent was signed on the chart.  DESCRIPTION OF PROCEDURE:  The patient was taken to the operating room where she was identified.  Spinal anesthetic was placed per Anesthesiology, and she was placed in the dorsal supine position with a 15-degree left lateral wedge.  She was prepped  vaginally with Betadine.  Foley catheter was placed and prepped abdominally with ChloraPrep.  Time-out was  undertaken.  After a 3-minute drying time, she was draped in a sterile fashion.  After testing for adequate spinal anesthesia, skin was entered  through a Pfannenstiel incision, and dissection was carried out in layers to the peritoneum.  Peritoneum was taken down.  Peritoneum was entered and extended superiorly and inferiorly.  Vesicouterine peritoneum was taken down cephalad laterally.  Bladder  flap developed, and the bladder blade was placed.  Uterus was entered through a low transverse incision, and the uterine cavity was entered bluntly with a hemostat.  Uterine incision was extended bilaterally with fingers.  Vertex was delivered without  difficulty.  Baby was delivered with good cry and tone noted.  After a 1-minute wait, the cord was clamped and cut, and the baby was handed to waiting pediatrics team.  Placenta was manually delivered.  Uterus was then exteriorized.  The cavity was  cleaned and the uterus was closed with 2 running locking imbricating layers of 0 Monocryl suture, which achieved good hemostasis.  Tubes and ovaries are normal bilaterally.  Right fallopian tube was then identified from cornu to fimbria, grasped in its  mid ampullary portion with a Babcock clamp.  A knuckle of tube was then doubly ligated with 2 free ties of plain suture, and the intervening knuckle was sharply resected.  Cautery was used to assure hemostasis.  A similar procedure was carried out on the  opposite side as well.  Uterus was returned to the abdomen, and lavage was carried out and good hemostasis was noted all around.  Anterior peritoneum was closed in a running fashion with 0 Monocryl suture, which was also used to reapproximate the  pyramidalis muscle  in the midline.  Anterior rectus fascia was closed in a running fashion with a looped 0 PDS.  The subcutaneous layer was closed with interrupted plain, and the skin was closed in a subcuticular fashion with a 4-0 Vicryl on a Keith  needle.  Benzoin,  Steri-Strips, honeycomb, and then pressure dressing were applied.  All counts were correct, and the patient was taken to recovery room in stable condition.  LN/NUANCE  D:07/22/2019 T:07/22/2019 JOB:[id number]

## 2019-07-22 NOTE — Lactation Note (Signed)
This note was copied from a baby's chart. Lactation Consultation Note  Patient Name: [name] [id number] Date: 07/22/2019   Attempt to see times two.  Mom sleeping.  Support person woke up.  Urged her to call lactation as needed.   Maternal Data    Feeding Feeding Type: Breast Fed  [facility] Score                   Interventions    Lactation Tools Discussed/Used     Consult Status      [name] 07/22/2019, 10:02 PM

## 2019-07-22 NOTE — Anesthesia Postprocedure Evaluation (Signed)
Anesthesia Post Note  Patient: Dejae Bernet  Procedure(s) Performed: CESAREAN SECTION WITH BILATERAL TUBAL LIGATION (Bilateral )     Patient location during evaluation: PACU Anesthesia Type: Spinal Level of consciousness: oriented and awake and alert Pain management: pain level controlled Vital Signs Assessment: post-procedure vital signs reviewed and stable Respiratory status: spontaneous breathing, respiratory function stable and patient connected to nasal cannula oxygen Cardiovascular status: blood pressure returned to baseline and stable Postop Assessment: no headache, no backache, no apparent nausea or vomiting and spinal receding Anesthetic complications: no    Last Vitals:  Vitals:   07/22/19 1450 07/22/19 1858  BP: (!) 110/58 120/76  Pulse: 72 66  Resp:  17  Temp: 36.6 C 36.7 C  SpO2: 100% 100%    Last Pain:  Vitals:   07/22/19 1858  TempSrc: Oral  PainSc: 0-No pain                 Effie Berkshire

## 2019-07-22 NOTE — Transfer of Care (Signed)
Immediate Anesthesia Transfer of Care Note  Patient: [name]  Procedure(s) Performed: CESAREAN SECTION WITH BILATERAL TUBAL LIGATION (Bilateral )  Patient Location: PACU  Anesthesia Type:Spinal  Level of Consciousness: awake, alert  and oriented  Airway & Oxygen Therapy: Patient Spontanous Breathing  Post-op Assessment: Report given to RN and Post -op Vital signs reviewed and stable  Post vital signs: Reviewed and stable  Last Vitals:  Vitals Value Taken Time  BP 105/54 07/22/19 0105  Temp    Pulse 63 07/22/19 0108  Resp 12 07/22/19 0108  SpO2 100 % 07/22/19 0108  Vitals shown include unvalidated device data.  Last Pain:  Vitals:   07/21/19 2230  TempSrc:   PainSc: 9          Complications: No apparent anesthesia complications

## 2019-07-22 NOTE — Progress Notes (Signed)
Subjective: Postpartum Day 0: Cesarean Delivery Patient reports tolerating PO.    Objective: Vital signs in last 24 hours: Temp:  [97.4 F (36.3 C)-98.1 F (36.7 C)] 97.9 F (36.6 C) (09/15 0621) Pulse Rate:  [47-103] 56 (09/15 0621) Resp:  [12-28] 16 (09/15 0621) BP: (100-142)/(54-103) 114/67 (09/15 0621) SpO2:  [96 %-100 %] 98 % (09/15 0621) Weight:  [82.1 kg] 82.1 kg (09/14 0950)  Physical Exam:  General: alert, cooperative, appears stated age and no distress Lochia: appropriate Uterine Fundus: firm Incision: healing well DVT Evaluation: No evidence of DVT seen on physical exam.  Recent Labs    07/21/19 0914 07/22/19 0709  HGB 12.3 10.0*  HCT 37.8 28.6*    Assessment/Plan: Status post Cesarean section. Doing well postoperatively.  Continue current care They do not want their baby circumcised.  Luz Lex 07/22/2019, 9:32 AM

## 2019-07-23 ENCOUNTER — Inpatient Hospital Stay (HOSPITAL_COMMUNITY): Payer: BC Managed Care – PPO

## 2019-07-23 LAB — CBC
HCT: 24.6 % — ABNORMAL LOW (ref 36.0–46.0)
Hemoglobin: 8.4 g/dL — ABNORMAL LOW (ref 12.0–15.0)
MCH: 30.3 pg (ref 26.0–34.0)
MCHC: 34.1 g/dL (ref 30.0–36.0)
MCV: 88.8 fL (ref 80.0–100.0)
Platelets: 201 10*3/uL (ref 150–400)
RBC: 2.77 MIL/uL — ABNORMAL LOW (ref 3.87–5.11)
RDW: 13.7 % (ref 11.5–15.5)
WBC: 21.1 10*3/uL — ABNORMAL HIGH (ref 4.0–10.5)
nRBC: 0 % (ref 0.0–0.2)

## 2019-07-23 NOTE — Lactation Note (Signed)
This note was copied from a baby's chart. Lactation Consultation Note  Patient Name: [name] [id number] Date: 07/23/2019 Reason for consult: Follow-up assessment;Mother's request P2, 40 hour female, -6% weight loss, mom with C/S delivery ,  infant currently cluster feeding. LC entered room infant very fussy and inconsolable. LC ask mom hand express and infant was given 4 ml of colostrum on spoon and appeared calmer after intake. Mom latched infant on right breast using cross cradle hold, LC ask mom express a small amount of colostrum out of breast prior to latching infant, infant latched easily with wide gape, tongue down and nose and chin touching breast. Infant was still breastfeeding after 12 minutes when LC left the room, LC discussed tips to keep infant stimulated to nurse longer than 10 minutes per feeding. LC discussed with  mom that she can hand express after latching infant to breast to give back more volume.  Mom knows she can call Nurse and [facility] if she has any questions, concerns or needs assistance with latching infant to breast.   Maternal Data    Feeding Feeding Type: Breast Fed  LATCH Score Latch: Grasps breast easily, tongue down, lips flanged, rhythmical sucking.  Audible Swallowing: Spontaneous and intermittent  Type of Nipple: Everted at rest and after stimulation  Comfort (Breast/Nipple): Soft / non-tender  Hold (Positioning): Assistance needed to correctly position infant at breast and maintain latch.  LATCH Score: 9  Interventions Interventions: Skin to skin;Adjust position;Support pillows;Position options;Expressed milk;Hand express  Lactation Tools Discussed/Used     Consult Status Consult Status: Follow-up Date: 07/23/19 Follow-up type: In-patient    Vicente Serene 07/23/2019, 6:40 AM

## 2019-07-23 NOTE — Progress Notes (Addendum)
Subjective: Postpartum Day 1: Cesarean Delivery Patient reports tolerating PO and no problems voiding.  Declines circ.  Objective: Vital signs in last 24 hours: Temp:  [97.9 F (36.6 C)-98.2 F (36.8 C)] 98 F (36.7 C) (09/16 0500) Pulse Rate:  [63-81] 81 (09/16 0500) Resp:  [17-18] 18 (09/16 0500) BP: (110-120)/(57-76) 119/76 (09/16 0500) SpO2:  [100 %] 100 % (09/15 1858)  Physical Exam:  General: alert, cooperative and appears stated age Lochia: appropriate Uterine Fundus: firm Incision: healing well, no dehiscence.  Pressure dressing removed and honeycomb saturated with old blood. DVT Evaluation: No evidence of DVT seen on physical exam. Negative Homan's sign. No cords or calf tenderness.  Recent Labs    07/21/19 0914 07/22/19 0709  HGB 12.3 10.0*  HCT 37.8 28.6*    Assessment/Plan: Status post Cesarean section. Doing well postoperatively.  Continue current care. Replace honeycomb dressing. Leukocytosis (31.3)-repeat CBC today  Linda Hedges 07/23/2019, 8:57 AM

## 2019-07-24 MED ORDER — DOCUSATE SODIUM 100 MG PO CAPS: 100.0000 mg | ORAL_CAPSULE | Freq: Two times a day (BID) | ORAL | 2 refills | Status: AC

## 2019-07-24 MED ORDER — FERROUS SULFATE 325 (65 FE) MG PO TBEC: 325.0000 mg | DELAYED_RELEASE_TABLET | Freq: Two times a day (BID) | ORAL | 2 refills | Status: AC

## 2019-07-24 MED ORDER — IBUPROFEN 600 MG PO TABS: 600.0000 mg | ORAL_TABLET | Freq: Four times a day (QID) | ORAL | 0 refills | Status: AC | PRN

## 2019-07-24 MED ORDER — OXYCODONE HCL 5 MG PO TABS: 5.0000 mg | ORAL_TABLET | ORAL | 0 refills | Status: AC | PRN

## 2019-07-24 NOTE — Discharge Summary (Signed)
Obstetric Discharge Summary Reason for Admission: onset of labor Prenatal Procedures: none Intrapartum Procedures: cesarean: low cervical, transverse, tubal ligation and s/s arrest of dilation in multip Postpartum Procedures: none Complications-Operative and Postpartum: none Hemoglobin  Date Value Ref Range Status  07/23/2019 8.4 (L) 12.0 - 15.0 g/dL Final   HCT  Date Value Ref Range Status  07/23/2019 24.6 (L) 36.0 - 46.0 % Final    Physical Exam:  General: alert, cooperative and appears stated age 10: appropriate Uterine Fundus: firm Incision: healing well, no significant drainage, no dehiscence, no significant erythema DVT Evaluation: No evidence of DVT seen on physical exam. Negative Homan's sign. No cords or calf tenderness. No significant calf/ankle edema.  Discharge Diagnoses: Term Pregnancy-delivered  Discharge Information: Date: 07/24/2019 Activity: pelvic rest Diet: routine Medications: PNV, Ibuprofen, Colace, Iron and oxycodone Condition: stable Instructions: refer to practice specific booklet Discharge to: home   Newborn Data: Live born female  Birth Weight: 8 lb 9.9 oz (3910 g) APGAR: 8, 9  Newborn Delivery   Birth date/time: 07/22/2017 00:05:00 Delivery type: C-Section, Low Transverse Trial of labor: Yes C-section categorization: Primary      Home with mother.  [name] 07/24/2019, 10:12 AM

## 2019-08-15 DIAGNOSIS — Z23 Encounter for immunization: Secondary | ICD-10-CM | POA: Diagnosis not present

## 2019-08-18 DIAGNOSIS — Z6825 Body mass index (BMI) 25.0-25.9, adult: Secondary | ICD-10-CM | POA: Diagnosis not present

## 2019-08-18 DIAGNOSIS — T8189XA Other complications of procedures, not elsewhere classified, initial encounter: Secondary | ICD-10-CM | POA: Diagnosis not present

## 2019-09-19 DIAGNOSIS — Z1389 Encounter for screening for other disorder: Secondary | ICD-10-CM | POA: Diagnosis not present

## 2019-09-19 DIAGNOSIS — Z124 Encounter for screening for malignant neoplasm of cervix: Secondary | ICD-10-CM | POA: Diagnosis not present

## 2019-11-04 DIAGNOSIS — Z20828 Contact with and (suspected) exposure to other viral communicable diseases: Secondary | ICD-10-CM | POA: Diagnosis not present

## 2019-11-04 DIAGNOSIS — R05 Cough: Secondary | ICD-10-CM | POA: Diagnosis not present

## 2019-12-17 ENCOUNTER — Encounter (HOSPITAL_COMMUNITY): Payer: Self-pay | Admitting: Student

## 2020-01-14 DIAGNOSIS — Z23 Encounter for immunization: Secondary | ICD-10-CM | POA: Diagnosis not present

## 2020-03-23 DIAGNOSIS — Z0000 Encounter for general adult medical examination without abnormal findings: Secondary | ICD-10-CM | POA: Diagnosis not present

## 2020-11-24 DIAGNOSIS — R509 Fever, unspecified: Secondary | ICD-10-CM | POA: Diagnosis not present

## 2021-05-23 DIAGNOSIS — L03119 Cellulitis of unspecified part of limb: Secondary | ICD-10-CM | POA: Diagnosis not present

## 2021-07-12 DIAGNOSIS — Z6825 Body mass index (BMI) 25.0-25.9, adult: Secondary | ICD-10-CM | POA: Diagnosis not present

## 2021-07-12 DIAGNOSIS — Z01419 Encounter for gynecological examination (general) (routine) without abnormal findings: Secondary | ICD-10-CM | POA: Diagnosis not present

## 2021-07-20 DIAGNOSIS — H938X3 Other specified disorders of ear, bilateral: Secondary | ICD-10-CM | POA: Diagnosis not present

## 2021-07-20 DIAGNOSIS — Z6823 Body mass index (BMI) 23.0-23.9, adult: Secondary | ICD-10-CM | POA: Diagnosis not present

## 2021-07-20 DIAGNOSIS — R071 Chest pain on breathing: Secondary | ICD-10-CM | POA: Diagnosis not present

## 2021-08-18 DIAGNOSIS — Z131 Encounter for screening for diabetes mellitus: Secondary | ICD-10-CM | POA: Diagnosis not present

## 2021-08-18 DIAGNOSIS — Z0000 Encounter for general adult medical examination without abnormal findings: Secondary | ICD-10-CM | POA: Diagnosis not present

## 2021-10-02 DIAGNOSIS — R059 Cough, unspecified: Secondary | ICD-10-CM | POA: Diagnosis not present

## 2021-10-02 DIAGNOSIS — Z20828 Contact with and (suspected) exposure to other viral communicable diseases: Secondary | ICD-10-CM | POA: Diagnosis not present

## 2021-10-02 DIAGNOSIS — J09X2 Influenza due to identified novel influenza A virus with other respiratory manifestations: Secondary | ICD-10-CM | POA: Diagnosis not present

## 2021-10-02 DIAGNOSIS — J069 Acute upper respiratory infection, unspecified: Secondary | ICD-10-CM | POA: Diagnosis not present

## 2021-10-04 DIAGNOSIS — L02411 Cutaneous abscess of right axilla: Secondary | ICD-10-CM | POA: Diagnosis not present

## 2021-10-17 DIAGNOSIS — Z23 Encounter for immunization: Secondary | ICD-10-CM | POA: Diagnosis not present

## 2022-07-14 DIAGNOSIS — Z6825 Body mass index (BMI) 25.0-25.9, adult: Secondary | ICD-10-CM | POA: Diagnosis not present

## 2022-07-14 DIAGNOSIS — Z124 Encounter for screening for malignant neoplasm of cervix: Secondary | ICD-10-CM | POA: Diagnosis not present

## 2022-07-14 DIAGNOSIS — Z01419 Encounter for gynecological examination (general) (routine) without abnormal findings: Secondary | ICD-10-CM | POA: Diagnosis not present

## 2022-08-18 DIAGNOSIS — Z23 Encounter for immunization: Secondary | ICD-10-CM | POA: Diagnosis not present

## 2022-11-28 DIAGNOSIS — D3131 Benign neoplasm of right choroid: Secondary | ICD-10-CM | POA: Diagnosis not present

## 2023-07-16 DIAGNOSIS — M545 Low back pain, unspecified: Secondary | ICD-10-CM | POA: Diagnosis not present

## 2023-08-20 DIAGNOSIS — Z23 Encounter for immunization: Secondary | ICD-10-CM | POA: Diagnosis not present

## 2023-08-22 DIAGNOSIS — Z124 Encounter for screening for malignant neoplasm of cervix: Secondary | ICD-10-CM | POA: Diagnosis not present

## 2023-08-22 DIAGNOSIS — Z01419 Encounter for gynecological examination (general) (routine) without abnormal findings: Secondary | ICD-10-CM | POA: Diagnosis not present

## 2023-08-22 DIAGNOSIS — Z6825 Body mass index (BMI) 25.0-25.9, adult: Secondary | ICD-10-CM | POA: Diagnosis not present

## 2024-04-01 DIAGNOSIS — N644 Mastodynia: Secondary | ICD-10-CM | POA: Diagnosis not present

## 2024-04-01 DIAGNOSIS — Z6825 Body mass index (BMI) 25.0-25.9, adult: Secondary | ICD-10-CM | POA: Diagnosis not present
